# Patient Record
Sex: Female | Born: 1970
Health system: Southern US, Community
[De-identification: ages and names within clinical notes are randomized; demographics above are authoritative.]

## PROBLEM LIST (undated history)

## (undated) DIAGNOSIS — N200 Calculus of kidney: Secondary | ICD-10-CM

## (undated) DIAGNOSIS — K219 Gastro-esophageal reflux disease without esophagitis: Secondary | ICD-10-CM

## (undated) DIAGNOSIS — R809 Proteinuria, unspecified: Secondary | ICD-10-CM

## (undated) DIAGNOSIS — R112 Nausea with vomiting, unspecified: Secondary | ICD-10-CM

## (undated) DIAGNOSIS — Z9889 Other specified postprocedural states: Secondary | ICD-10-CM

## (undated) DIAGNOSIS — N809 Endometriosis, unspecified: Secondary | ICD-10-CM

## (undated) DIAGNOSIS — I10 Essential (primary) hypertension: Secondary | ICD-10-CM

## (undated) DIAGNOSIS — Z87442 Personal history of urinary calculi: Secondary | ICD-10-CM

## (undated) DIAGNOSIS — E785 Hyperlipidemia, unspecified: Secondary | ICD-10-CM

## (undated) DIAGNOSIS — N189 Chronic kidney disease, unspecified: Secondary | ICD-10-CM

## (undated) DIAGNOSIS — A4902 Methicillin resistant Staphylococcus aureus infection, unspecified site: Secondary | ICD-10-CM

## (undated) HISTORY — PX: COLONOSCOPY: SHX174

## (undated) HISTORY — DX: Calculus of kidney: N20.0

## (undated) HISTORY — DX: Chronic kidney disease, unspecified: N18.9

## (undated) HISTORY — PX: DIAGNOSTIC LAPAROSCOPY: SUR761

## (undated) HISTORY — DX: Hyperlipidemia, unspecified: E78.5

## (undated) HISTORY — PX: DILATION AND CURETTAGE OF UTERUS: SHX78

## (undated) HISTORY — PX: CERVICAL DISCECTOMY: SHX98

## (undated) HISTORY — DX: Endometriosis, unspecified: N80.9

## (undated) HISTORY — DX: Proteinuria, unspecified: R80.9

## (undated) HISTORY — PX: REDUCTION MAMMAPLASTY: SUR839

---

## 2002-04-20 ENCOUNTER — Inpatient Hospital Stay (HOSPITAL_COMMUNITY): Admission: AD | Admit: 2002-04-20 | Discharge: 2002-04-20 | Payer: Self-pay | Admitting: *Deleted

## 2002-04-21 ENCOUNTER — Inpatient Hospital Stay (HOSPITAL_COMMUNITY): Admission: AD | Admit: 2002-04-21 | Discharge: 2002-04-21 | Payer: Self-pay | Admitting: Obstetrics and Gynecology

## 2002-05-24 ENCOUNTER — Inpatient Hospital Stay (HOSPITAL_COMMUNITY): Admission: AD | Admit: 2002-05-24 | Discharge: 2002-05-26 | Payer: Self-pay | Admitting: Obstetrics and Gynecology

## 2002-07-04 ENCOUNTER — Other Ambulatory Visit: Admission: RE | Admit: 2002-07-04 | Discharge: 2002-07-04 | Payer: Self-pay | Admitting: Obstetrics and Gynecology

## 2003-07-23 ENCOUNTER — Other Ambulatory Visit: Admission: RE | Admit: 2003-07-23 | Discharge: 2003-07-23 | Payer: Self-pay | Admitting: Obstetrics and Gynecology

## 2004-07-09 ENCOUNTER — Ambulatory Visit (HOSPITAL_COMMUNITY): Admission: RE | Admit: 2004-07-09 | Discharge: 2004-07-09 | Payer: Self-pay | Admitting: Obstetrics and Gynecology

## 2004-07-09 ENCOUNTER — Encounter (INDEPENDENT_AMBULATORY_CARE_PROVIDER_SITE_OTHER): Payer: Self-pay | Admitting: *Deleted

## 2005-08-26 ENCOUNTER — Inpatient Hospital Stay (HOSPITAL_COMMUNITY): Admission: RE | Admit: 2005-08-26 | Discharge: 2005-08-26 | Payer: Self-pay | Admitting: Obstetrics & Gynecology

## 2005-08-27 ENCOUNTER — Inpatient Hospital Stay (HOSPITAL_COMMUNITY): Admission: AD | Admit: 2005-08-27 | Discharge: 2005-08-27 | Payer: Self-pay | Admitting: Obstetrics and Gynecology

## 2005-09-21 ENCOUNTER — Inpatient Hospital Stay (HOSPITAL_COMMUNITY): Admission: RE | Admit: 2005-09-21 | Discharge: 2005-09-23 | Payer: Self-pay | Admitting: Obstetrics and Gynecology

## 2006-07-05 ENCOUNTER — Encounter: Admission: RE | Admit: 2006-07-05 | Discharge: 2006-07-05 | Payer: Self-pay | Admitting: Obstetrics and Gynecology

## 2009-08-15 ENCOUNTER — Encounter: Admission: RE | Admit: 2009-08-15 | Discharge: 2009-08-15 | Payer: Self-pay | Admitting: Orthopedic Surgery

## 2009-09-02 ENCOUNTER — Ambulatory Visit (HOSPITAL_COMMUNITY): Admission: RE | Admit: 2009-09-02 | Discharge: 2009-09-03 | Payer: Self-pay | Admitting: Neurological Surgery

## 2009-10-01 ENCOUNTER — Encounter: Admission: RE | Admit: 2009-10-01 | Discharge: 2009-10-01 | Payer: Self-pay | Admitting: Neurological Surgery

## 2009-12-02 ENCOUNTER — Encounter: Admission: RE | Admit: 2009-12-02 | Discharge: 2009-12-02 | Payer: Self-pay | Admitting: Anesthesiology

## 2010-02-25 ENCOUNTER — Encounter
Admission: RE | Admit: 2010-02-25 | Discharge: 2010-02-25 | Payer: Self-pay | Source: Home / Self Care | Attending: Neurological Surgery | Admitting: Neurological Surgery

## 2010-06-01 LAB — CBC
HCT: 42.6 % (ref 36.0–46.0)
MCHC: 34 g/dL (ref 30.0–36.0)
MCV: 89.9 fL (ref 78.0–100.0)
Platelets: 237 10*3/uL (ref 150–400)
RBC: 4.74 MIL/uL (ref 3.87–5.11)
WBC: 6.2 10*3/uL (ref 4.0–10.5)

## 2010-06-01 LAB — DIFFERENTIAL
Basophils Relative: 0 % (ref 0–1)
Eosinophils Relative: 2 % (ref 0–5)
Monocytes Absolute: 0.7 10*3/uL (ref 0.1–1.0)
Monocytes Relative: 12 % (ref 3–12)
Neutro Abs: 3.2 10*3/uL (ref 1.7–7.7)

## 2010-06-01 LAB — PROTIME-INR: INR: 1 (ref 0.00–1.49)

## 2010-06-01 LAB — BASIC METABOLIC PANEL
BUN: 9 mg/dL (ref 6–23)
Chloride: 98 mEq/L (ref 96–112)
Creatinine, Ser: 0.86 mg/dL (ref 0.4–1.2)
GFR calc non Af Amer: >60 mL/min (ref >60)
Glucose, Bld: 99 mg/dL (ref 70–99)
Sodium: 139 mEq/L (ref 135–145)

## 2010-06-01 LAB — SURGICAL PCR SCREEN: Staphylococcus aureus: POSITIVE — AB

## 2010-06-04 ENCOUNTER — Encounter (HOSPITAL_COMMUNITY)
Admission: RE | Admit: 2010-06-04 | Discharge: 2010-06-04 | Disposition: A | Discharge status: Home / Self Care | Payer: BC Managed Care – PPO | Source: Ambulatory Visit | Attending: Obstetrics and Gynecology | Admitting: Obstetrics and Gynecology

## 2010-06-04 LAB — BASIC METABOLIC PANEL
BUN: 10 mg/dL (ref 6–23)
Calcium: 9 mg/dL (ref 8.4–10.5)
GFR calc Af Amer: >60 mL/min (ref >60)
GFR calc non Af Amer: >60 mL/min (ref >60)
Glucose, Bld: 119 mg/dL — ABNORMAL HIGH (ref 70–99)
Sodium: 139 mEq/L (ref 135–145)

## 2010-06-04 LAB — SURGICAL PCR SCREEN
MRSA, PCR: NEGATIVE
Staphylococcus aureus: NEGATIVE

## 2010-06-04 LAB — CBC
HCT: 41.9 % (ref 36.0–46.0)
MCHC: 33.2 g/dL (ref 30.0–36.0)
MCV: 87.8 fL (ref 78.0–100.0)
RBC: 4.77 MIL/uL (ref 3.87–5.11)
WBC: 7.1 10*3/uL (ref 4.0–10.5)

## 2010-06-10 ENCOUNTER — Other Ambulatory Visit: Payer: Self-pay | Admitting: Obstetrics and Gynecology

## 2010-06-10 ENCOUNTER — Ambulatory Visit (HOSPITAL_COMMUNITY)
Admission: RE | Admit: 2010-06-10 | Discharge: 2010-06-10 | Disposition: A | Discharge status: Home / Self Care | Payer: BC Managed Care – PPO | Source: Ambulatory Visit | Attending: Obstetrics and Gynecology | Admitting: Obstetrics and Gynecology

## 2010-06-10 DIAGNOSIS — Z01818 Encounter for other preprocedural examination: Secondary | ICD-10-CM | POA: Insufficient documentation

## 2010-06-10 DIAGNOSIS — D279 Benign neoplasm of unspecified ovary: Secondary | ICD-10-CM | POA: Insufficient documentation

## 2010-06-10 DIAGNOSIS — Z01812 Encounter for preprocedural laboratory examination: Secondary | ICD-10-CM | POA: Insufficient documentation

## 2010-07-04 NOTE — H&P (Signed)
  Ashley Edwards, Ashley Edwards             ACCOUNT NO.:  [id number]  MEDICAL RECORD NO.:  [id number]        PATIENT TYPE:  WAMB  LOCATION:                                [facility]:  [facility]  PHYSICIAN:  Lenoard Aden, M.D.DATE OF BIRTH:  [birth year]  DATE OF ADMISSION:  06/10/2010 DATE OF DISCHARGE:                             HISTORY & PHYSICAL   CHIEF COMPLAINT:  Symptomatic enlarging uterine fibroids.  HISTORY OF PRESENT ILLNESS:  She is a 40 year old white female G4 P3 with a history of vaginal delivery x3, SAB x1, symptomatic enlarging fibroids and pelvic pain.  MEDICATIONS:  Xanax p.r.n., diltiazem CD, hydrochlorothiazide, and Tylenol Extra Strength p.r.n.  ALLERGIES:  She has allergies to DILAUDID.  No known latex allergies.  FAMILY HISTORY:  Stroke, chronic hypertension, skin cancer.  PERSONAL HISTORY:  Three vaginal deliveries complicated by chronic hypertension, history of cryosurgery in 1998, history of neck surgery with disk replacement in 2011 and again a missed AB in 2006, contraceptive management to include vasectomy.  PHYSICAL EXAMINATION:  VITAL SIGNS:  Weight of 160 pounds, blood pressure 122/80, height 5 feet 1-1/2 inches. GENERAL:  She is well-developed, well-nourished white female, in no acute distress. NECK:  Supple.  Full range of motion. LUNGS:  Clear. HEART:  Regular rate and rhythm. ABDOMEN:  Soft, nontender. PELVIC:  Enlarged and irregularly shaped uterus with irregularity since the fibroids. NEUROLOGIC:  Nonfocal. SKIN:  Intact.  IMPRESSION:  Enlarging uterine fibroids with left lateral fibroids consistent with questionable pedunculated fibroids responsible for the patient's  continueed pelvic discomfort.  PLAN:  To proceed with da Vinci assisted robotic myomectomy. The risks of anesthesia, infection, bleeding, injury to abdominal organs, need for repair, delayed versus immediate complications to include bowel and bladder injury noted,  postoperative risks include DVT, pneumonia, and need for blood transfusion is noted, possible recurrence risks of fibroids is 20-25%, possible need for hysterectomy during this procedure approximately 5% noted.  The patient acknowledges, consents are signed and desires to proceed.     Lenoard Aden, M.D.     RJT/MEDQ  D:  06/10/2010  T:  06/10/2010  Job:  [id number]  Electronically Signed by Olivia Mackie M.D. on 07/04/2010 03:17:27 PM

## 2010-07-04 NOTE — Op Note (Signed)
NAMEANIS, CINELLI             ACCOUNT NO.:  [id number]  MEDICAL RECORD NO.:  [id number]           PATIENT TYPE:  O  LOCATION:  [facility]                          [facility]  PHYSICIAN:  Lenoard Aden, M.D.DATE OF BIRTH:  1970-12-08  DATE OF PROCEDURE: DATE OF DISCHARGE:                              OPERATIVE REPORT   PREOPERATIVE DIAGNOSIS:  Symptomatic uterine fibroids.  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  PROCEDURES:  Da Vinci-assisted left salpingo-oophorectomy with pelvic washings and frozen section as noted.  SURGEON:  Lenoard Aden, MD  ASSISTANT:  Genia Del, MD  ANESTHESIA:  General.  ESTIMATED BLOOD LOSS:  Less than 50 mL.  COMPLICATIONS:  None.  DRAINS:  Foley.  COUNTS:  Correct.  The patient recovered in good condition.  BRIEF OPERATIVE NOTE:  After being apprised of the risks of anesthesia, infection, bleeding, injury to abdominal organs, need for repair delayed versus immediate complications to include bowel and bladder injury, possible need for repair, the patient was brought to the operating room. Please note that prior to this surgery previously when this mass was first diagnosed approximately 2-3 years ago, it was thought to be either a left ovarian mass versus a fibroid.  At that time an MRI was performed which reported this mass consistent with a pedunculated uterine fibroid. The patient therefore has signed consent.  She was brought to the operating room where she was administered general anesthetic without complications, prepped and draped in usual sterile fashion.  Foley catheter was placed.  After achieving adequate anesthesia, exam under anesthesia revealed an irregularly shaped pelvic mass thought to be consistent with a uterine fibroid.  The Rumi retractor was placed vaginally without difficulty and attention turned to the abdominal portion of the  procedure whereby an incision was made about 3 fingerbreadths above the umbilicus.  Veress needle was placed, opening pressure -2 noted and 3-1/2 L of CO2 insufflated without difficulty. Trocar was placed atraumatically.  Additional trocar sites were placed in preparation for the robot in a semicircular fashion around this midline incision approximately 10 cm apart and all trocars were placed under direct visualization, three 8 mm trocars and a 10-mm trocar for the assistance port.  Visualization at this time has revealed a left multilobulated left ovarian mass which has a smooth surface and a fibrous appearance to it.  The uterus appears otherwise normal.  The right ovary appears normal.  The left ovary was torsed in conjunction with this mass.  At this time decision was made to proceed with pelvic washings which were collected in the usual fashion and sent to pathology.  Approximately 500 mL total and biopsy forceps were entered but the mass due to its fibers element was difficult to biopsy. Therefore a piece of the mass was grasped and EndoShears were used to remove a 2-3 cm portion of this mass which was then sent for frozen section.  Good hemostasis has been noted at this time, minimal bleeding from that area and the mass was sent for frozen section was confirmed to be by Dr. Nedra Hai an ovarian fibrothecoma with no  malignant elements noted and appeared benign per her report.  Therefore decision was made to proceed with removal of the mass which was approximately 10-12 cm in size.  The infundibulopelvic ligament and the course of the ureters were identified on the left.  The PK forceps were used to cauterize the infundibulopelvic ligament and all the way down to the tuboovarian ligament on the left side completely detaching the mass and its entirety.  Good hemostasis was noted.  Irrigation was accomplished and due to its benign nature, the decision was made to morcellate the mass which  was morcellated using the Storz morcellator in its entirety. Irrigation was accomplished.  Good hemostasis was noted.  The right ovary and tube as noted appeared normal.  The uterus appeared normal. Anterior and posterior cul-de-sac appeared normal.  The appendix appeared normal.  Liver and gallbladder area all appeared normal, thereby bowel and omentum all appeared normal upon visualization and irrigation.  At this time, all instruments were removed under direct visualization.  CO2 was released.  Incisions were closed using 0 Vicryl, 4-0 Vicryl and Dermabond.  Rumi retractor was removed from the vagina. Foley catheter was removed.  The patient tolerated the procedure well, was awakened and transferred to recovery room in good condition.     Lenoard Aden, M.D.     RJT/MEDQ  D:  06/10/2010  T:  06/11/2010  Job:  [id number]  cc:   [facility]  Electronically Signed by Olivia Mackie M.D. on 07/04/2010 03:17:36 PM

## 2010-08-01 NOTE — H&P (Signed)
   Ashley Edwards, WUNSCHEL                         ACCOUNT NO.:  [id number]   MEDICAL RECORD NO.:  [id number]                   PATIENT TYPE:  INP   LOCATION:  NA                                   [facility]   PHYSICIAN:  Lenoard Aden, M.D.             DATE OF BIRTH:  1970-06-04   DATE OF ADMISSION:  DATE OF DISCHARGE:                                HISTORY & PHYSICAL   CHIEF COMPLAINT:  Chronic hypertension and advanced dilatation at term.   HISTORY OF PRESENT ILLNESS:  The patient is a 40 year old white female G2,  P1 at 38 weeks with 4 to 5 cm of dilatation and chronic hypertension on  Labetalol for induction.   MEDICATIONS:  Patient is on Labetalol 100 mg b.i.d.   ALLERGIES:  Patient has no known drug allergies.   PAST MEDICAL HISTORY:  Her past obstetrical history is remarkable for an  uncomplicated vaginal delivery of a 7 pound 8 ounce female in 2002.  The  patient otherwise has had no other surgery.  The patient has a history of  wisdom tooth removal.   FAMILY HISTORY:  Family history of myocardial infarction, urinary tract  infections and macular degeneration.   LABORATORY DATA:  Prenatal laboratory data blood type O positive, Rh  negative,  rubella immune.   PHYSICAL EXAMINATION:  GENERAL:  The patient is a well-developed, well-  nourished white female in no apparent distress.  HEENT:  Normal.  LUNGS:  Clear.  HEART:  Regular rhythm.  ABDOMEN:  Soft, gravid, nontender.  Estimated fetal weight of approximately  7.5 pounds.  PELVIC:  Cervix is 4 to 5 cm dilated, 80%, vertex and 0.  EXTREMITIES:  Normal.  NEUROLOGICAL:  Examination is nonfocal.   IMPRESSION:  Term intrauterine pregnancy with chronic hypertension and  advanced dilatation for induction.  Group beta Strep negative.   PLAN:  Proceed with induction.                                               Lenoard Aden, M.D.    RJT/MEDQ  D:  05/24/2002  T:  05/24/2002  Job:  [id number]

## 2010-08-01 NOTE — Op Note (Signed)
Ashley Edwards, Ashley Edwards             ACCOUNT NO.:  [id number]   MEDICAL RECORD NO.:  [id number]          PATIENT TYPE:  AMB   LOCATION:  [facility]                           [facility]   PHYSICIAN:  Lenoard Aden, M.D.DATE OF BIRTH:  1970/10/11   DATE OF PROCEDURE:  07/09/2004  DATE OF DISCHARGE:                                 OPERATIVE REPORT   PREOPERATIVE DIAGNOSES:  Missed abortion at six weeks.   POSTOPERATIVE DIAGNOSES:  Missed abortion at six weeks.   PROCEDURE:  Suction D&E.   SURGEON:  Lenoard Aden, M.D.   ANESTHESIA:  MAC paracervical.   ESTIMATED BLOOD LOSS:  Less than 50 mL.   COMPLICATIONS:  None.   DRAINS:  None.   COUNTS:  Correct.   Patient to recovery in good condition.   DESCRIPTION OF PROCEDURE:  After being apprised of the risks of anesthesia,  infection, bleeding, uterine perforation, possible need for repair, delayed  risks, immediate complications to include bowel and bladder injury from  uterine perforation, the patient was brought to the operating room after  consents are signed. Placed in dorsal lithotomy position, feet are placed in  the yellow fin stirrups, prepped and draped in the usual sterile fashion,  catheterized until the bladder was empty. Examination under anesthesia  reveals a six week anteflexed uterus, no adnexal masses. Dilute Marcaine  solution, 20 mL of 0.25% solution placed at standard paracervical block. The  uterus sounds to 8 cm and dilates easily up to a #21 Pratt dilator. A 7 mm  suction curette placed, products of conception noted and aspirated without  difficulty. Repeat suction and curettage in a four quadrant method reveals  the cavity to be empty. Estimated blood loss less than 50 mL. Procedure is  terminated. The patient is awakened and transferred to recovery in good  condition.      RJT/MEDQ  D:  07/09/2004  T:  07/09/2004  Job:  [id number]

## 2010-08-01 NOTE — H&P (Signed)
Ashley Edwards, Edwards             ACCOUNT NO.:  [id number]   MEDICAL RECORD NO.:  [id number]          PATIENT TYPE:  INP   LOCATION:  9163                          [facility]   PHYSICIAN:  Lenoard Aden, M.D.DATE OF BIRTH:  01-Jul-1970   DATE OF ADMISSION:  09/21/2005  DATE OF DISCHARGE:                                HISTORY & PHYSICAL   CHIEF COMPLAINT:  Chronic hypertension, at term.   HISTORY OF PRESENT ILLNESS:  The patient is a 40 year old white female G3,  P2 who presents at 37 weeks for induction due to advanced dilatation,  history of precipitous labor, and chronic hypertension.   ALLERGIES:  NO KNOWN DRUG ALLERGIES.   MEDICATIONS:  Labetalol 100 mg p.o. t.i.d.   SOCIAL HISTORY:  She is nonsmoker, nondrinker.  Denies domestic or physical  violence.   PAST MEDICAL HISTORY:  History of chronic hypertension chronically on  medications; otherwise, uncomplicated past medical history.  She had a  history of an ovarian cyst, condyloma, and a D&C for a missed AB in 2006.   PAST OBSTETRICAL HISTORY:  Remarkable for one uncomplicated D&C for a missed  AB and two uncomplicated vaginal deliveries complicated by chronic  hypertension and advanced dilatation and precipitous labor.  Pregnancy  course otherwise uncomplicated.   FAMILY HISTORY:  Stroke, chronic hypertension, skin cancer.   PHYSICAL EXAMINATION:  GENERAL:  She is a well-developed, well-nourished  white female in no acute distress.  VITAL SIGNS:  Blood pressure 130/72.  HEENT:  Normal.  LUNGS:  Clear.  HEART:  Regular rate and rhythm.  ABDOMEN:  Soft, gravid, and nontender.  PELVIC:  The cervix is 3-4 cm, 80%, vertex, and 0.  EXTREMITIES:  __________  NEUROLOGIC:  Nonfocal.  Intact.  SKIN:  Intact.   IMPRESSION:  1.  37-week intrauterine pregnancy  2.  Chronic hypertension.  3.  Advanced dilatation.   PLAN:  Proceed with controlled attempt at vaginal delivery, preeclampsia  precautions.  Small risk  of prematurity discussed.  Patient acknowledges.  Will proceed.      Lenoard Aden, M.D.  Electronically Signed     RJT/MEDQ  D:  09/21/2005  T:  09/21/2005  Job:  [id number]

## 2011-12-18 ENCOUNTER — Encounter (HOSPITAL_COMMUNITY): Payer: Self-pay | Admitting: Pharmacist

## 2011-12-25 ENCOUNTER — Encounter (HOSPITAL_COMMUNITY)
Admission: RE | Admit: 2011-12-25 | Discharge: 2011-12-25 | Disposition: A | Discharge status: Home / Self Care | Payer: BC Managed Care – PPO | Source: Ambulatory Visit | Attending: Obstetrics and Gynecology | Admitting: Obstetrics and Gynecology

## 2011-12-25 ENCOUNTER — Encounter (HOSPITAL_COMMUNITY): Payer: Self-pay

## 2011-12-25 HISTORY — DX: Essential (primary) hypertension: I10

## 2011-12-25 LAB — CBC
MCH: 28.7 pg (ref 26.0–34.0)
MCHC: 32.4 g/dL (ref 30.0–36.0)
Platelets: 207 10*3/uL (ref 150–400)
RBC: 4.6 MIL/uL (ref 3.87–5.11)

## 2011-12-25 LAB — BASIC METABOLIC PANEL
CO2: 26 mEq/L (ref 19–32)
Chloride: 99 mEq/L (ref 96–112)
GFR calc non Af Amer: >90 mL/min (ref >90)
Potassium: 3.3 mEq/L — ABNORMAL LOW (ref 3.5–5.1)
Sodium: 137 mEq/L (ref 135–145)

## 2011-12-25 NOTE — Patient Instructions (Signed)
Your procedure is scheduled on:01/07/12  Enter through the Main Entrance at :1130am  Pick up desk phone and dial [phone] and inform us of your arrival.  Please call [phone] if you have any problems the morning of surgery.  Remember: Do not eat after midnight:Wed Do not drink after:9am on Thursday  Take these meds the morning of surgery with a sip of water:BP pill  DO NOT wear jewelry, eye make-up, lipstick,body lotion, or dark fingernail polish. Do not shave for 48 hours prior to surgery.   Patients discharged on the day of surgery will not be allowed to drive home.   Remember to use your Hibiclens as instructed.

## 2011-12-25 NOTE — Pre-Procedure Instructions (Signed)
Pt has h/o positive MRSA swab and will need to be swabbed on DOS.

## 2011-12-30 ENCOUNTER — Other Ambulatory Visit: Payer: Self-pay | Admitting: Obstetrics and Gynecology

## 2012-01-06 NOTE — H&P (Signed)
NAMECASSARA, Ashley Edwards             ACCOUNT NO.:  [id number]  MEDICAL RECORD NO.:  [id number]  LOCATION:  [facility]                         [facility]  [facility]  PHYSICIAN:  Lenoard Aden, M.D.DATE OF BIRTH:  [birth year]  DATE OF ADMISSION:  11/19/2011 DATE OF DISCHARGE:                             HISTORY & PHYSICAL   Women's outpatient surgery on January 07, 2012.  CHIEF COMPLAINT:  Refractory dysmenorrhea and menorrhagia.  She is a 41- year-old white female, G4, P3, has been status post oophorectomy who presented for definitive management of menorrhagia and dysmenorrhea.  ALLERGIES:  DILAUDID.  SOCIAL HISTORY:  She is a nonsmoker, nondrinker.  Denies domestic or physical violence.  MEDICATIONS:  Include Zofran p.r.n., Xanax p.r.n.  Hydrochlorothiazide, diltiazem, and Tylenol p.r.n.  She has personal history of hypertension.  FAMILY HISTORY:  Stroke, skin cancer, chronic hypertension.  OB HISTORY:  History of 3 vaginal deliveries, and 1 missed AB.  She had a history of robotic surgery for ovarian fibroma.  She has history D and C prior surgery.  PHYSICAL EXAMINATION:  GENERAL:  A well-developed, well-nourished, white female, in no acute distress. HEENT:  Normal. NECK:  Supple.  Full range of motion. LUNGS:  Clear. HEART:  Regular rhythm. ABDOMEN:  Soft and nontender. PELVIC:  Uterus to be normal size, shape, and contour, anteflexed.  No adnexal masses.  IMPRESSION:  Refractory dysmenorrhea and menorrhagia for definitive therapy.  PLAN:  Proceed with diagnostic hysteroscopy, D and C, NovaSure.  Risks of anesthesia, infection, bleeding, injury to abdominal organs, need for repair was discussed, delayed versus immediate complications to include bowel and bladder injury noted.  The patient acknowledges and wishes to proceed.     Lenoard Aden, M.D.     RJT/MEDQ  D:  01/06/2012  T:  01/06/2012  Job:  [id number]

## 2012-01-07 ENCOUNTER — Encounter (HOSPITAL_COMMUNITY): Payer: Self-pay | Admitting: Anesthesiology

## 2012-01-07 ENCOUNTER — Encounter (HOSPITAL_COMMUNITY)
Admission: RE | Disposition: A | Discharge status: Home / Self Care | Payer: Self-pay | Source: Ambulatory Visit | Attending: Obstetrics and Gynecology

## 2012-01-07 ENCOUNTER — Ambulatory Visit (HOSPITAL_COMMUNITY)
Admission: RE | Admit: 2012-01-07 | Discharge: 2012-01-07 | Disposition: A | Discharge status: Home / Self Care | Payer: BC Managed Care – PPO | Source: Ambulatory Visit | Attending: Obstetrics and Gynecology | Admitting: Obstetrics and Gynecology

## 2012-01-07 ENCOUNTER — Ambulatory Visit (HOSPITAL_COMMUNITY): Payer: BC Managed Care – PPO | Admitting: Anesthesiology

## 2012-01-07 DIAGNOSIS — Z01812 Encounter for preprocedural laboratory examination: Secondary | ICD-10-CM | POA: Insufficient documentation

## 2012-01-07 DIAGNOSIS — N946 Dysmenorrhea, unspecified: Secondary | ICD-10-CM | POA: Insufficient documentation

## 2012-01-07 DIAGNOSIS — Z01818 Encounter for other preprocedural examination: Secondary | ICD-10-CM | POA: Insufficient documentation

## 2012-01-07 DIAGNOSIS — N920 Excessive and frequent menstruation with regular cycle: Secondary | ICD-10-CM | POA: Insufficient documentation

## 2012-01-07 LAB — HCG, SERUM, QUALITATIVE: Preg, Serum: NEGATIVE

## 2012-01-07 SURGERY — DILATATION & CURETTAGE/HYSTEROSCOPY WITH NOVASURE ABLATION
Anesthesia: General | Site: Vagina | Wound class: Clean Contaminated

## 2012-01-07 MED ORDER — LACTATED RINGERS IV SOLN
INTRAVENOUS | Status: DC
  Administered 2012-01-07 (×2): via INTRAVENOUS

## 2012-01-07 MED ORDER — TRAMADOL HCL 50 MG PO TABS: 50.0000 mg | ORAL_TABLET | Freq: Four times a day (QID) | ORAL | Status: DC | PRN

## 2012-01-07 MED ORDER — CEFAZOLIN SODIUM-DEXTROSE 2-3 GM-% IV SOLR
2.0000 g | INTRAVENOUS | Status: AC
  Administered 2012-01-07: 2 g via INTRAVENOUS

## 2012-01-07 MED ORDER — KETOROLAC TROMETHAMINE 30 MG/ML IJ SOLN
INTRAMUSCULAR | Status: DC | PRN
  Administered 2012-01-07: 30 mg via INTRAVENOUS
  Administered 2012-01-07: 30 mg via INTRAMUSCULAR

## 2012-01-07 MED ORDER — PROPOFOL 10 MG/ML IV EMUL
INTRAVENOUS | Status: DC | PRN
  Administered 2012-01-07: 200 mg via INTRAVENOUS

## 2012-01-07 MED ORDER — BUPIVACAINE HCL (PF) 0.25 % IJ SOLN
INTRAMUSCULAR | Status: DC | PRN
  Administered 2012-01-07: 20 mL

## 2012-01-07 MED ORDER — DEXAMETHASONE SODIUM PHOSPHATE 4 MG/ML IJ SOLN
INTRAMUSCULAR | Status: DC | PRN
  Administered 2012-01-07: 8 mg via INTRAVENOUS

## 2012-01-07 MED ORDER — LACTATED RINGERS IR SOLN
Status: DC | PRN
  Administered 2012-01-07: 3000 mL

## 2012-01-07 MED ORDER — OXYCODONE-ACETAMINOPHEN 5-325 MG PO TABS: 1.0000 | ORAL_TABLET | ORAL | Status: DC | PRN

## 2012-01-07 MED ORDER — MIDAZOLAM HCL 5 MG/5ML IJ SOLN
INTRAMUSCULAR | Status: DC | PRN
  Administered 2012-01-07: 2 mg via INTRAVENOUS

## 2012-01-07 MED ORDER — GLYCOPYRROLATE 0.2 MG/ML IJ SOLN
INTRAMUSCULAR | Status: DC | PRN
  Administered 2012-01-07 (×2): 0.1 mg via INTRAVENOUS

## 2012-01-07 MED ORDER — FENTANYL CITRATE 0.05 MG/ML IJ SOLN
INTRAMUSCULAR | Status: DC | PRN
  Administered 2012-01-07: 100 ug via INTRAVENOUS
  Administered 2012-01-07 (×2): 50 ug via INTRAVENOUS
  Administered 2012-01-07 (×2): 25 ug via INTRAVENOUS

## 2012-01-07 MED ORDER — LIDOCAINE HCL (CARDIAC) 20 MG/ML IV SOLN
INTRAVENOUS | Status: DC | PRN
  Administered 2012-01-07: 50 mg via INTRAVENOUS

## 2012-01-07 MED ORDER — ONDANSETRON HCL 4 MG/2ML IJ SOLN
INTRAMUSCULAR | Status: DC | PRN
  Administered 2012-01-07: 4 mg via INTRAVENOUS

## 2012-01-07 SURGICAL SUPPLY — 13 items
ABLATOR ENDOMETRIAL BIPOLAR (ABLATOR) ×2 IMPLANT
CATH ROBINSON RED A/P 16FR (CATHETERS) ×2 IMPLANT
CLOTH BEACON ORANGE TIMEOUT ST (SAFETY) ×2 IMPLANT
CONTAINER PREFILL 10% NBF 60ML (FORM) ×4 IMPLANT
DRESSING TELFA 8X3 (GAUZE/BANDAGES/DRESSINGS) ×2 IMPLANT
GLOVE BIO SURGEON STRL SZ7.5 (GLOVE) ×4 IMPLANT
GOWN STRL REIN XL XLG (GOWN DISPOSABLE) ×4 IMPLANT
PACK HYSTEROSCOPY LF (CUSTOM PROCEDURE TRAY) ×2 IMPLANT
PAD OB MATERNITY 4.3X12.25 (PERSONAL CARE ITEMS) ×2 IMPLANT
PAD PREP 24X48 CUFFED NSTRL (MISCELLANEOUS) ×2 IMPLANT
SYR TB 1ML 25GX5/8 (SYRINGE) ×2 IMPLANT
TOWEL OR 17X24 6PK STRL BLUE (TOWEL DISPOSABLE) ×4 IMPLANT
WATER STERILE IRR 1000ML POUR (IV SOLUTION) ×2 IMPLANT

## 2012-01-07 NOTE — Progress Notes (Signed)
Patient ID: Ashley Edwards, female   DOB: 1971/01/16, 41 y.o.   MRN: [id number] Patient seen and examined. Consent witnessed and signed. No changes noted. Update completed.

## 2012-01-07 NOTE — Anesthesia Preprocedure Evaluation (Signed)
Anesthesia Evaluation  Patient identified by MRN, date of birth, ID band Patient awake    Reviewed: Allergy & Precautions, H&P , NPO status , Patient's Chart, lab work & pertinent test results  Airway Mallampati: II TM Distance: >3 FB Neck ROM: full    Dental No notable dental hx. (+) Teeth Intact   Pulmonary neg pulmonary ROS,    Pulmonary exam normal       Cardiovascular hypertension, Pt. on medications Rhythm:regular Rate:Normal     Neuro/Psych negative neurological ROS     GI/Hepatic negative GI ROS, Neg liver ROS,   Endo/Other  negative endocrine ROS  Renal/GU negative Renal ROS  negative genitourinary   Musculoskeletal   Abdominal Normal abdominal exam  (+)   Peds  Hematology negative hematology ROS (+)   Anesthesia Other Findings   Reproductive/Obstetrics negative OB ROS Menorrhagia                            Anesthesia Physical Anesthesia Plan  ASA: II  Anesthesia Plan: General LMA   Post-op Pain Management:    Induction:   Airway Management Planned:   Additional Equipment:   Intra-op Plan:   Post-operative Plan:   Informed Consent: I have reviewed the patients History and Physical, chart, labs and discussed the procedure including the risks, benefits and alternatives for the proposed anesthesia with the patient or authorized representative who has indicated his/her understanding and acceptance.   Dental Advisory Given  Plan Discussed with: Anesthesiologist, CRNA and Surgeon  Anesthesia Plan Comments:         Anesthesia Quick Evaluation

## 2012-01-07 NOTE — Op Note (Signed)
01/07/2012  1:58 PM  PATIENT:  Campbell Stall  41 y.o. female  PRE-OPERATIVE DIAGNOSIS:  Menorrhagia-Refractory  POST-OPERATIVE DIAGNOSIS:  menorrhagia  PROCEDURE:  Procedure(s): DILATATION & CURETTAGE/HYSTEROSCOPY WITH NOVASURE ABLATION  SURGEON:  Surgeon(s): Lenoard Aden, MD  ASSISTANTS: none   ANESTHESIA:   local and general  ESTIMATED BLOOD LOSS: minimal  DRAINS: none   LOCAL MEDICATIONS USED:  MARCAINE     SPECIMEN:  Source of Specimen:  [facility]  DISPOSITION OF SPECIMEN:  PATHOLOGY  COUNTS:  YES  DICTATION #: [id number]  PLAN OF CARE: DC home today  PATIENT DISPOSITION:  PACU - hemodynamically stable.

## 2012-01-07 NOTE — Transfer of Care (Signed)
Immediate Anesthesia Transfer of Care Note  Patient: Ashley Edwards  Procedure(s) Performed: Procedure(s) (LRB) with comments: DILATATION & CURETTAGE/HYSTEROSCOPY WITH NOVASURE ABLATION (N/A)  Patient Location: PACU  Anesthesia Type: General  Level of Consciousness: awake, alert  and oriented  Airway & Oxygen Therapy: Patient Spontanous Breathing and Patient connected to nasal cannula oxygen  Post-op Assessment: Report given to PACU RN and Post -op Vital signs reviewed and stable  Post vital signs: Reviewed and stable  Complications: No apparent anesthesia complications

## 2012-01-07 NOTE — Anesthesia Postprocedure Evaluation (Signed)
  Anesthesia Post-op Note  Patient: Ashley Edwards  Procedure(s) Performed: Procedure(s) (LRB) with comments: DILATATION & CURETTAGE/HYSTEROSCOPY WITH NOVASURE ABLATION (N/A)  Patient is awake and responsive. Pain and nausea are reasonably well controlled. Vital signs are stable and clinically acceptable. Oxygen saturation is clinically acceptable. There are no apparent anesthetic complications at this time. Patient is ready for discharge.

## 2012-01-08 NOTE — Op Note (Signed)
[name], Ashley Edwards             ACCOUNT NO.:  [id number]  MEDICAL RECORD NO.:  [id number]  LOCATION:  [facility]                          [facility]ILITY:  [facility]  PHYSICIAN:  Lenoard Aden, M.D.DATE OF BIRTH:  [birth year]  DATE OF PROCEDURE: DATE OF DISCHARGE:  01/07/2012                              OPERATIVE REPORT   DESCRIPTION OF PROCEDURE:  After being apprised of the risks of anesthesia, infection, bleeding, injury to abdominal organs, possible need for repair, delayed versus immediate complications to include bowel and bladder injury, possible need for repair, inability to cure all bleeding, the patient was brought to the operating room after consents were signed.  She was prepped and draped in usual sterile fashion.  Feet were placed in Yellofin stirrups.  Exam under anesthesia reveals a normal sized anteflexed uterus and no adnexal masses.  Dilute paracervical block placed with a dilute Marcaine solution 20 mL total, cervix easily dilated up to a #23 Pratt dilator.  Hysteroscope placed. Visualization reveals a normal endometrial cavity with questionable thickening of the endometrial, anterior wall, bilateral normal tubal ostia noted.  At this time, D and C is performed in a 4-quadrant method using sharp curettage.  Repeat visualization reveals an otherwise normal endometrial cavity.  The NovaSure device was then placed, it is seated to a length of 6.5 cm, a width of 4 cm and the CO2 test was performed and is negative.  The procedure was initiated to a power of 143 watts for 85 seconds.  At the termination of procedure, device was removed, inspected and found to be without evidence of defect.  Uterus was re- visualized and no evidence of uterine perforation noted and a well- ablated endometrial cavity is appreciated.  Good hemostasis was noted. Fluid deficit less than 50 mL.  The patient tolerated the procedure well, was awakened, transferred to recovery in good  condition.     Lenoard Aden, M.D.     RJT/MEDQ  D:  01/07/2012  T:  01/08/2012  Job:  [id number]

## 2012-04-01 ENCOUNTER — Other Ambulatory Visit: Payer: Self-pay | Admitting: Obstetrics and Gynecology

## 2012-04-01 DIAGNOSIS — Z1231 Encounter for screening mammogram for malignant neoplasm of breast: Secondary | ICD-10-CM

## 2012-05-09 ENCOUNTER — Ambulatory Visit
Admission: RE | Admit: 2012-05-09 | Discharge: 2012-05-09 | Disposition: A | Discharge status: Home / Self Care | Payer: BC Managed Care – PPO | Source: Ambulatory Visit | Attending: Obstetrics and Gynecology | Admitting: Obstetrics and Gynecology

## 2012-05-09 DIAGNOSIS — Z1231 Encounter for screening mammogram for malignant neoplasm of breast: Secondary | ICD-10-CM

## 2012-11-03 DIAGNOSIS — R102 Pelvic and perineal pain: Secondary | ICD-10-CM | POA: Insufficient documentation

## 2013-04-03 ENCOUNTER — Other Ambulatory Visit: Payer: Self-pay

## 2013-04-03 DIAGNOSIS — Z1231 Encounter for screening mammogram for malignant neoplasm of breast: Secondary | ICD-10-CM

## 2013-05-11 ENCOUNTER — Ambulatory Visit: Payer: BC Managed Care – PPO

## 2013-05-31 ENCOUNTER — Ambulatory Visit
Admission: RE | Admit: 2013-05-31 | Discharge: 2013-05-31 | Disposition: A | Discharge status: Home / Self Care | Payer: BC Managed Care – PPO | Source: Ambulatory Visit

## 2013-05-31 DIAGNOSIS — Z1231 Encounter for screening mammogram for malignant neoplasm of breast: Secondary | ICD-10-CM

## 2014-03-16 HISTORY — PX: BREAST REDUCTION SURGERY: SHX8

## 2014-11-08 ENCOUNTER — Other Ambulatory Visit: Payer: Self-pay | Admitting: Plastic Surgery

## 2015-03-21 MED FILL — ALPRAZolam 0.5 MG TABS: 0.5 | 10 days supply | Qty: 30 | Fill #0

## 2015-04-22 MED FILL — DILTIAZEM 24HR ER 240 MG CA: 240 | 90 days supply | Qty: 90 | Fill #3

## 2015-05-16 MED FILL — ALPRAZolam 0.5 MG TABS: 0.5 | 10 days supply | Qty: 30 | Fill #0

## 2015-05-20 MED FILL — ONDANSETRON HCL 8 MG TABLET: 8 | 7 days supply | Qty: 20 | Fill #0

## 2015-07-10 MED FILL — CARTIA XT 240 MG CAPSULE: 240 | 90 days supply | Qty: 90 | Fill #4

## 2015-07-11 MED FILL — ALPRAZolam 0.5 MG TABS: 0.5 | 10 days supply | Qty: 30 | Fill #0

## 2015-08-01 DIAGNOSIS — M79672 Pain in left foot: Secondary | ICD-10-CM | POA: Diagnosis not present

## 2015-08-09 DIAGNOSIS — Z1231 Encounter for screening mammogram for malignant neoplasm of breast: Secondary | ICD-10-CM | POA: Diagnosis not present

## 2015-08-09 DIAGNOSIS — Z1151 Encounter for screening for human papillomavirus (HPV): Secondary | ICD-10-CM | POA: Diagnosis not present

## 2015-08-09 DIAGNOSIS — Z01419 Encounter for gynecological examination (general) (routine) without abnormal findings: Secondary | ICD-10-CM | POA: Diagnosis not present

## 2015-08-09 DIAGNOSIS — Z6828 Body mass index (BMI) 28.0-28.9, adult: Secondary | ICD-10-CM | POA: Diagnosis not present

## 2015-08-19 DIAGNOSIS — M79672 Pain in left foot: Secondary | ICD-10-CM | POA: Diagnosis not present

## 2015-08-23 MED FILL — ALPRAZolam 0.5 MG TABS: 0.5 | 10 days supply | Qty: 30 | Fill #0

## 2015-10-04 MED FILL — CARTIA XT 240 MG CAPSULE: 240 | 90 days supply | Qty: 90 | Fill #0

## 2015-10-07 MED FILL — ALPRAZolam 0.5 MG TABS: 0.5 | 10 days supply | Qty: 30 | Fill #0

## 2015-11-04 DIAGNOSIS — D225 Melanocytic nevi of trunk: Secondary | ICD-10-CM | POA: Diagnosis not present

## 2015-11-04 DIAGNOSIS — L820 Inflamed seborrheic keratosis: Secondary | ICD-10-CM | POA: Diagnosis not present

## 2015-11-04 DIAGNOSIS — D2262 Melanocytic nevi of left upper limb, including shoulder: Secondary | ICD-10-CM | POA: Diagnosis not present

## 2015-11-04 DIAGNOSIS — D2261 Melanocytic nevi of right upper limb, including shoulder: Secondary | ICD-10-CM | POA: Diagnosis not present

## 2015-12-04 DIAGNOSIS — Z0000 Encounter for general adult medical examination without abnormal findings: Secondary | ICD-10-CM | POA: Diagnosis not present

## 2015-12-04 MED FILL — ALPRAZolam 0.5 MG TABS: 0.5 | 10 days supply | Qty: 30 | Fill #0

## 2015-12-11 DIAGNOSIS — J3089 Other allergic rhinitis: Secondary | ICD-10-CM | POA: Diagnosis not present

## 2015-12-11 DIAGNOSIS — Z0000 Encounter for general adult medical examination without abnormal findings: Secondary | ICD-10-CM | POA: Diagnosis not present

## 2015-12-11 DIAGNOSIS — Z8249 Family history of ischemic heart disease and other diseases of the circulatory system: Secondary | ICD-10-CM | POA: Diagnosis not present

## 2015-12-11 DIAGNOSIS — N946 Dysmenorrhea, unspecified: Secondary | ICD-10-CM | POA: Diagnosis not present

## 2015-12-11 DIAGNOSIS — Z23 Encounter for immunization: Secondary | ICD-10-CM | POA: Diagnosis not present

## 2015-12-11 DIAGNOSIS — Z1389 Encounter for screening for other disorder: Secondary | ICD-10-CM | POA: Diagnosis not present

## 2015-12-11 DIAGNOSIS — I10 Essential (primary) hypertension: Secondary | ICD-10-CM | POA: Diagnosis not present

## 2015-12-11 MED FILL — ATORVASTATIN 10 MG TABLET: 10 | 90 days supply | Qty: 90 | Fill #0

## 2015-12-11 MED FILL — VALSARTAN 40 MG TABLET: 40 | 90 days supply | Qty: 90 | Fill #0

## 2015-12-11 MED FILL — CARTIA XT 120 MG CAPSULE SA: 120 | 90 days supply | Qty: 90 | Fill #0

## 2016-01-01 ENCOUNTER — Ambulatory Visit (INDEPENDENT_AMBULATORY_CARE_PROVIDER_SITE_OTHER): Payer: BLUE CROSS/BLUE SHIELD | Admitting: Neurology

## 2016-01-01 ENCOUNTER — Encounter: Payer: Self-pay | Admitting: Neurology

## 2016-01-01 VITALS — BP 140/88 | HR 72 | Resp 20 | Ht 67.0 in | Wt 185.0 lb

## 2016-01-01 DIAGNOSIS — G4763 Sleep related bruxism: Secondary | ICD-10-CM | POA: Diagnosis not present

## 2016-01-01 DIAGNOSIS — G4733 Obstructive sleep apnea (adult) (pediatric): Secondary | ICD-10-CM | POA: Diagnosis not present

## 2016-01-01 DIAGNOSIS — M2619 Other specified anomalies of jaw-cranial base relationship: Secondary | ICD-10-CM | POA: Diagnosis not present

## 2016-01-01 DIAGNOSIS — R0683 Snoring: Secondary | ICD-10-CM | POA: Diagnosis not present

## 2016-01-01 DIAGNOSIS — I10 Essential (primary) hypertension: Secondary | ICD-10-CM

## 2016-01-01 DIAGNOSIS — E785 Hyperlipidemia, unspecified: Secondary | ICD-10-CM

## 2016-01-01 NOTE — Progress Notes (Signed)
SLEEP MEDICINE CLINIC   Provider:  Larey Seat, M D  Referring Provider: Crist Infante, MD Primary Care Physician:  Jerlyn Ly, MD  Chief Complaint  Patient presents with  . New Patient (Initial Visit)    snoring, large tonsils    HPI:  Ashley Edwards is a 45 y.o. female , seen here as a referral from Dr. 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 She is married with 3 children and also has a fourth child living with her. 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 She has not had any late shifts in several months now. She reports that she has no problems falling asleep, sometimes she will wake up from a choking or gasping sensation and her husband has taped her snoring, which led to this referral.   Sleep habits are as follows: She usually goes to bed between 9:30 10 PM and is promptly asleep, she always has a TV on by going to sleep. Her husband tends to go to bed later than her will turn the TV off when he comes to the bedroom, about an hour after her. Throughout the night the bedroom will be cool, quiet and dark. She always goes to sleep on her side but resumes a supine position later and this is how her husband actually videotaped her. Her snoring was quite loud, her breathing irregular with crescendo snoring. Her dog was in bed with her! She sleeps on 2 soft pillows does not have a very firm support. She rises usually at 5 AM and may have only one bathroom break at night -many nights none. If her sleep is interrupted she finds it not difficult to resume sleep. She feels refreshed and restored in the morning may have a dry mouth but is not complaining of headaches, diaphoresis, palpitations, and  rarely has heartburn.  Sleep medical history and family sleep history: neck surgery , C 4-6, after developing complete numbness and tingling in her  left hand. She is pain-free now. Dr. Sherley Bounds. Ashley Edwards has never undergone a tonsillectomy, adenoidectomy but she had an anterior neck fusion. In childhood she had no sleep disorders that she can recall, there was no sleepwalking, night terrors, no enuresis. 2 of her children had adenoid ectomy.   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 2 dogs . 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 She is a nonuser of tobacco, she drinks about 2-3 glasses of wine a week no hard liquor.   She noted alcohol impacts her sleep.  Exercise 5 days a week.    Review of Systems: Out of a complete 14 system review, the patient complains of only the following symptoms, and all other reviewed systems are negative.  Snoring with witnessed apneas in supine.   Epworth score  3, Fatigue severity score  15   , depression score n/a    Social History   Social History  . Marital status: Married    Spouse name: N/A  . Number of children: N/A  . Years of education: N/A   Occupational History  . Not on file.   Social History Main Topics  . Smoking status: Never Smoker  . Smokeless tobacco:  Not on file  . Alcohol use Yes     Comment: wine sociallly  . Drug use: No  . Sexual activity: Not on file   Other Topics Concern  . Not on file   Social History Narrative  . No narrative on file    No family history on file.  Past Medical History:  Diagnosis Date  . Hyperlipemia   . Hypertension   . Microalbuminuria     Past Surgical History:  Procedure Laterality Date  . CERVICAL DISCECTOMY    . DIAGNOSTIC LAPAROSCOPY    . DILATION AND CURETTAGE OF UTERUS      Current Outpatient Prescriptions  Medication Sig Dispense Refill  . atorvastatin (LIPITOR) 10 MG tablet Take 10 mg by mouth daily.    Marland Kitchen diltiazem (CARTIA XT) 120 MG 24 hr capsule Take 120 mg by mouth daily.     Marland Kitchen ibuprofen (ADVIL,MOTRIN) 200 MG tablet Take 600 mg by mouth every 6 (six) hours as needed. For pain    . mometasone (NASONEX) 50 MCG/ACT nasal spray Place 2 sprays into the nose daily as needed.     . Multiple Vitamin (MULTIVITAMIN WITH MINERALS) TABS Take 1 tablet by mouth daily.    . valsartan (DIOVAN) 40 MG tablet Take 40 mg by mouth daily.     No current facility-administered medications for this visit.     Allergies as of 01/01/2016 - Review Complete 01/01/2016  Allergen Reaction Noted  . Hydromorphone Swelling 12/18/2011    Vitals: BP 140/88   Pulse 72   Resp 20   Ht 5\' 7"  (1.702 m)   Wt 185 lb (83.9 kg)   BMI 28.98 kg/m  Last Weight:  Wt Readings from Last 1 Encounters:  01/01/16 185 lb (83.9 kg)   [id number] mass index is 28.98 kg/m.     Last Height:   Ht Readings from Last 1 Encounters:  01/01/16 5\' 7"  (1.702 m)    Physical exam:  General: The patient is awake, alert and appears not in acute distress. The patient is well groomed. Head: Normocephalic, atraumatic. Neck is supple. Mallampati 3, tip touches the tongue, large tonsils but not kissing.  neck circumference: 15. Nasal airflow , TMJ click from Bruxism.  Mild retrognathia is seen.  Cardiovascular:  Regular rate and rhythm, without  murmurs or carotid bruit, and without distended neck veins. Respiratory: Lungs are clear to auscultation. Skin:  Without evidence of edema, or rash Trunk: BMI is 29 . The patient's posture is erect   Neurologic exam : The patient is awake and alert, oriented to place and time.   Memory subjective  described as intact.  Attention span & concentration ability appears normal.  Speech is fluent,  without dysarthria, dysphonia or aphasia.  Mood and affect are appropriate.  Cranial nerves: Pupils are equal and briskly reactive to light. Funduscopic exam without evidence of pallor or edema. Extraocular movements  in vertical and horizontal planes intact and without nystagmus.  Visual fields by finger perimetry are intact. Hearing to finger rub intact.   Facial sensation intact to fine touch.  Facial motor strength is symmetric and tongue and uvula move midline. Shoulder shrug was symmetrical.   Motor exam:  Normal tone, muscle bulk and symmetric strength in all extremities.  Sensory:  Fine touch, pinprick and vibration were tested in all extremities. Proprioception tested in the upper extremities was normal.  Coordination: Finger-to-nose maneuver  normal without evidence of ataxia, dysmetria or tremor.  Gait and station: Patient walks  without assistive device and is able unassisted to climb up to the exam table. Strength within normal limits.  Stance is stable and normal.   Deep tendon reflexes: in the upper and lower extremities are symmetric and intact. Brisk.   The patient was advised of the nature of the diagnosed sleep disorder , the treatment options and risks for general a health and wellness arising from not treating the condition.  I spent more than 45  minutes of face to face time with the patient. Greater than 50% of time was spent in counseling and coordination of care. We have discussed the diagnosis and differential and I answered the patient's questions.     Assessment:  After physical and neurologic examination, review of laboratory studies,  Personal review of imaging studies, reports of other /same  Imaging studies ,  Results of polysomnography/ neurophysiology testing and pre-existing records as far as provided in visit., my assessment is   1) I will invite Mrs. Chappelle to a split-night polysomnography a on the high suspicion that she will have all as a. Her husband's video has convinced her that she indeed snores, and has some sleep disordered breathing. It has not reflected in daytime somnolence or fatigue and she has kept a regular exercise routine. She also has excellent sleep habits aside from her TV in the bedroom. But she keeps routine  times. She rarely drinks alcohol, and has noted that alcohol intake affects her sleep. She is a history of heartburn but treated with Pepcid. She has very mild retrognathia and a history of bruxism and I noted a TMJ click on her right jaw. If her apnea is very mild M.D. mainly resort to treat snoring we should consider a dental device for this patient.  RV after Sleep study.        Asencion Partridge Syed Zukas MD  01/01/2016   CC: Crist Infante, [address], [place] [address]

## 2016-01-01 NOTE — Patient Instructions (Signed)

## 2016-01-30 MED FILL — ALPRAZolam 0.5 MG TABS: 0.5 | 10 days supply | Qty: 30 | Fill #0

## 2016-02-18 ENCOUNTER — Telehealth: Payer: Self-pay | Admitting: Neurology

## 2016-02-18 DIAGNOSIS — G4730 Sleep apnea, unspecified: Secondary | ICD-10-CM

## 2016-02-18 NOTE — Telephone Encounter (Signed)
[employer] denied [place] and I got an authorization for HST.  Can I get an order for HST?

## 2016-02-18 NOTE — Telephone Encounter (Signed)
insurance denied in lab study, HST was ordered . CD

## 2016-03-04 MED FILL — VALSARTAN 40 MG TABLET: 40 | 90 days supply | Qty: 90 | Fill #1

## 2016-03-04 MED FILL — CARTIA XT 120 MG CAPSULE SA: 120 | 90 days supply | Qty: 90 | Fill #1

## 2016-03-04 MED FILL — ATORVASTATIN 10 MG TABLET: 10 | 90 days supply | Qty: 90 | Fill #1

## 2016-04-06 ENCOUNTER — Ambulatory Visit (INDEPENDENT_AMBULATORY_CARE_PROVIDER_SITE_OTHER): Payer: BLUE CROSS/BLUE SHIELD | Admitting: Neurology

## 2016-04-06 DIAGNOSIS — G4733 Obstructive sleep apnea (adult) (pediatric): Secondary | ICD-10-CM

## 2016-04-06 DIAGNOSIS — G4730 Sleep apnea, unspecified: Secondary | ICD-10-CM

## 2016-04-07 MED FILL — ALPRAZolam 0.5 MG TABS: 0.5 | 10 days supply | Qty: 30 | Fill #0

## 2016-04-09 ENCOUNTER — Telehealth: Payer: Self-pay | Admitting: Neurology

## 2016-04-09 NOTE — Procedures (Signed)
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 She reports that she has no problems falling asleep, staying asleep -and only sometimes will she wake up from a choking or gasping sensation. Her husband has taped her snoring, which led to this referral.   Her dog sleeps in bed with her.  Epworth score 3, Fatigue severity score 15 , BMI 29.  STUDY RESULTS:  Total Recording Time:  7h 37m  Total Apnea/Hypopnea Index (AHI) 1.8/hr.   Average Oxygen Desaturation: SpO2 at 95%  Lowest Oxygen desaturation: 86% with a total desaturation time of 1 minute.    IMPRESSION: No apnea of clinical significance was recorded , but moderate to severe snoring.  Heart rate remained in normal range, the patient experienced no prolonged desaturation.    RECOMMENDATION: Snoring treatment by dental device, weight loss and/ or ENT procedure. CPAP is not indicated. I certify that I have reviewed the raw data recording prior to the issuance of this report in accordance with the standards of Accreditation of the American Academy of Sleep medicine (AASM) Larey Seat, MD   04-09-2016 Diplomat, [place] of Psychiatry and Neurology

## 2016-04-09 NOTE — Telephone Encounter (Signed)
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 She reports that she has no problems falling asleep, staying asleep -and only sometimes will she wake up from a choking or gasping sensation. Her husband has taped her snoring, which led to this referral.  Epworth score 3, Fatigue severity score 15 , BMI 29.  STUDY RESULTS:  Total Recording Time:  7h 48m  Total Apnea/Hypopnea Index (AHI) 1.8/hr.   Average Oxygen Desaturation: SpO2 at 95%  Lowest Oxygen desaturation: 86% with a total desaturation time of 1 minute.    IMPRESSION: No apnea of clinical significance was recorded , but moderate to severe snoring.  Heart rate remained in normal range, the patient experienced no prolonged desaturation.  RECOMMENDATION; Snoring treatment by dental device, weight loss and/ or ENT procedure. CPAP is not indicated. I certify that I have reviewed the raw data recording prior to the issuance of this report in accordance with the standards of Accreditation of the American Academy of Sleep medicine (AASM) Larey Seat, MD   04-09-2016 Diplomat, American Board of Psychiatry and Neurology Diplomat, American Board of Sleep Medicine Medical Director of [facility] [employer]

## 2016-04-14 NOTE — Telephone Encounter (Signed)
I called pt. I advised her that her sleep study results revealed no significant apnea but did show moderate to severe snoring. Dr. Brett Fairy does recommend treatment by a dental device, weight loss, and/or ENT procedure. CPAP is not indicated. Pt says that she will check with her dentist to find out if they do dental devices for snoring. If not, she will call our office back. Pt asked that I send these results to Dr. Joylene Draft. Pt declined a follow up at this time with Dr. Brett Fairy. Pt verbalized understanding of results. Pt had no questions at this time but was encouraged to call back if questions arise.

## 2016-05-15 DIAGNOSIS — H25013 Cortical age-related cataract, bilateral: Secondary | ICD-10-CM | POA: Diagnosis not present

## 2016-05-15 DIAGNOSIS — H43813 Vitreous degeneration, bilateral: Secondary | ICD-10-CM | POA: Diagnosis not present

## 2016-05-15 DIAGNOSIS — H5213 Myopia, bilateral: Secondary | ICD-10-CM | POA: Diagnosis not present

## 2016-05-15 DIAGNOSIS — H04123 Dry eye syndrome of bilateral lacrimal glands: Secondary | ICD-10-CM | POA: Diagnosis not present

## 2016-06-02 MED FILL — CARTIA XT 120 MG CAPSULE SA: 120 | 90 days supply | Qty: 90 | Fill #2

## 2016-06-02 MED FILL — ATORVASTATIN 10 MG TABLET: 10 | 90 days supply | Qty: 90 | Fill #2

## 2016-06-02 MED FILL — VALSARTAN 40 MG TABLET: 40 | 90 days supply | Qty: 90 | Fill #2

## 2016-06-03 MED FILL — ALPRAZolam 0.5 MG TABS: 0.5 | 10 days supply | Qty: 30 | Fill #0

## 2016-08-13 MED FILL — ALPRAZolam 0.5 MG TABS: 0.5 | 10 days supply | Qty: 30 | Fill #0

## 2016-09-01 MED FILL — VALSARTAN 40 MG TABLET: 40 | 90 days supply | Qty: 90 | Fill #3

## 2016-09-01 MED FILL — ATORVASTATIN 10 MG TABLET: 10 | 90 days supply | Qty: 90 | Fill #3

## 2016-09-01 MED FILL — CARTIA XT 120 MG CAPSULE SA: 120 | 90 days supply | Qty: 90 | Fill #3

## 2016-09-22 MED FILL — ONDANSETRON HCL 4 MG TABLET: 4 | 7 days supply | Qty: 30 | Fill #0

## 2016-09-22 MED FILL — CIPROFLOXACIN HCL 500 MG TA: 500 | 7 days supply | Qty: 14 | Fill #0

## 2016-10-13 MED FILL — TELMISARTAN 20 MG TABLET: 20 | 90 days supply | Qty: 90 | Fill #0

## 2016-10-13 MED FILL — ALPRAZolam 0.5 MG TABS: 0.5 | 10 days supply | Qty: 30 | Fill #0

## 2016-10-29 DIAGNOSIS — Z01419 Encounter for gynecological examination (general) (routine) without abnormal findings: Secondary | ICD-10-CM | POA: Diagnosis not present

## 2016-10-29 DIAGNOSIS — Z1231 Encounter for screening mammogram for malignant neoplasm of breast: Secondary | ICD-10-CM | POA: Diagnosis not present

## 2016-10-29 DIAGNOSIS — Z6828 Body mass index (BMI) 28.0-28.9, adult: Secondary | ICD-10-CM | POA: Diagnosis not present

## 2016-12-03 MED FILL — CARTIA XT 120 MG CAPSULE SA: 120 | 90 days supply | Qty: 90 | Fill #4

## 2016-12-03 MED FILL — ATORVASTATIN 10 MG TABLET: 10 | 90 days supply | Qty: 90 | Fill #4

## 2017-01-01 DIAGNOSIS — Z23 Encounter for immunization: Secondary | ICD-10-CM | POA: Diagnosis not present

## 2017-01-06 MED FILL — ALPRAZolam 0.5 MG TABS: 0.5 | 10 days supply | Qty: 30 | Fill #0

## 2017-01-07 MED FILL — TELMISARTAN 20 MG TABLET: 20 | 90 days supply | Qty: 90 | Fill #1

## 2017-02-02 DIAGNOSIS — I10 Essential (primary) hypertension: Secondary | ICD-10-CM | POA: Diagnosis not present

## 2017-02-02 DIAGNOSIS — Z0000 Encounter for general adult medical examination without abnormal findings: Secondary | ICD-10-CM | POA: Diagnosis not present

## 2017-02-02 DIAGNOSIS — R82998 Other abnormal findings in urine: Secondary | ICD-10-CM | POA: Diagnosis not present

## 2017-02-08 DIAGNOSIS — Z0000 Encounter for general adult medical examination without abnormal findings: Secondary | ICD-10-CM | POA: Diagnosis not present

## 2017-02-08 DIAGNOSIS — E7849 Other hyperlipidemia: Secondary | ICD-10-CM | POA: Diagnosis not present

## 2017-02-08 DIAGNOSIS — R808 Other proteinuria: Secondary | ICD-10-CM | POA: Diagnosis not present

## 2017-02-08 DIAGNOSIS — Z8249 Family history of ischemic heart disease and other diseases of the circulatory system: Secondary | ICD-10-CM | POA: Diagnosis not present

## 2017-02-08 DIAGNOSIS — Z1389 Encounter for screening for other disorder: Secondary | ICD-10-CM | POA: Diagnosis not present

## 2017-02-08 DIAGNOSIS — R0683 Snoring: Secondary | ICD-10-CM | POA: Diagnosis not present

## 2017-03-01 MED FILL — CARTIA XT 120 MG CAPSULE SA: 120 | 90 days supply | Qty: 90 | Fill #0

## 2017-03-01 MED FILL — metroNIDAZOLE 500 MG TABS: 500 | 7 days supply | Qty: 14 | Fill #0

## 2017-03-01 MED FILL — FLUCONAZOLE 150 MG TABLET: 150 | 7 days supply | Qty: 3 | Fill #0

## 2017-03-02 MED FILL — TERCONAZOLE 0.8% VAGINAL CR: 0.8 | 3 days supply | Qty: 20 | Fill #0

## 2017-03-02 MED FILL — SULFAMETHOXAZOLE/TMP DS TAB: 800-160 | 3 days supply | Qty: 6 | Fill #0

## 2017-03-02 MED FILL — ALPRAZolam 0.5 MG TABS: 0.5 | 10 days supply | Qty: 30 | Fill #0

## 2017-03-05 DIAGNOSIS — R3915 Urgency of urination: Secondary | ICD-10-CM | POA: Diagnosis not present

## 2017-03-05 MED FILL — TAMSULOSIN HCL 0.4 MG CAP: 0.4 | 30 days supply | Qty: 30 | Fill #0

## 2017-03-08 MED FILL — predniSONE 10 MG TABS: 10 | 6 days supply | Qty: 21 | Fill #0

## 2017-03-08 MED FILL — AMOX-CLAV 875-125 MG TABLET: 875-125 | 7 days supply | Qty: 14 | Fill #0

## 2017-03-25 MED FILL — ATORVASTATIN 10 MG TABLET: 10 | 90 days supply | Qty: 90 | Fill #0

## 2017-04-12 DIAGNOSIS — N390 Urinary tract infection, site not specified: Secondary | ICD-10-CM | POA: Diagnosis not present

## 2017-04-12 DIAGNOSIS — R3915 Urgency of urination: Secondary | ICD-10-CM | POA: Diagnosis not present

## 2017-04-12 MED FILL — CEFDINIR 300 MG CAPSULE: 300 | 5 days supply | Qty: 10 | Fill #0

## 2017-04-15 DIAGNOSIS — N202 Calculus of kidney with calculus of ureter: Secondary | ICD-10-CM | POA: Diagnosis not present

## 2017-04-15 DIAGNOSIS — N200 Calculus of kidney: Secondary | ICD-10-CM | POA: Diagnosis not present

## 2017-04-15 DIAGNOSIS — N281 Cyst of kidney, acquired: Secondary | ICD-10-CM | POA: Diagnosis not present

## 2017-04-15 MED FILL — TAMSULOSIN HCL 0.4 MG CAP: 0.4 | 30 days supply | Qty: 30 | Fill #0

## 2017-04-22 MED FILL — ALPRAZolam 0.5 MG TABS: 0.5 | 10 days supply | Qty: 30 | Fill #0

## 2017-05-03 DIAGNOSIS — N202 Calculus of kidney with calculus of ureter: Secondary | ICD-10-CM | POA: Diagnosis not present

## 2017-05-03 DIAGNOSIS — N200 Calculus of kidney: Secondary | ICD-10-CM | POA: Diagnosis not present

## 2017-05-14 DIAGNOSIS — N200 Calculus of kidney: Secondary | ICD-10-CM | POA: Diagnosis not present

## 2017-05-26 MED FILL — CARTIA XT 120 MG CAPSULE SA: 120 | 90 days supply | Qty: 90 | Fill #1

## 2017-06-03 DIAGNOSIS — N200 Calculus of kidney: Secondary | ICD-10-CM | POA: Diagnosis not present

## 2017-06-03 DIAGNOSIS — R1909 Other intra-abdominal and pelvic swelling, mass and lump: Secondary | ICD-10-CM | POA: Diagnosis not present

## 2017-06-14 MED FILL — ALPRAZolam 0.5 MG TABS: 0.5 | 10 days supply | Qty: 30 | Fill #0

## 2017-06-15 MED FILL — ATORVASTATIN 10 MG TABLET: 10 | 90 days supply | Qty: 90 | Fill #1

## 2017-07-20 MED FILL — ALPRAZolam 0.5 MG TABS: 0.5 | 10 days supply | Qty: 30 | Fill #0

## 2017-08-18 MED FILL — CARTIA XT 120 MG CAPSULE SA: 120 | 90 days supply | Qty: 90 | Fill #2

## 2017-08-19 MED FILL — ALPRAZolam 0.5 MG TABS: 0.5 | 10 days supply | Qty: 30 | Fill #0

## 2017-08-31 DIAGNOSIS — R1084 Generalized abdominal pain: Secondary | ICD-10-CM | POA: Diagnosis not present

## 2017-09-01 ENCOUNTER — Encounter (HOSPITAL_COMMUNITY): Payer: Self-pay | Admitting: General Practice

## 2017-09-01 ENCOUNTER — Other Ambulatory Visit: Payer: Self-pay | Admitting: Urology

## 2017-09-01 NOTE — H&P (Signed)
Office Visit Report     08/31/2017    Ashley Edwards         MRN: [id number]  PRIMARY CARE:  Jeannette How. Perini, MD  DOB: [birth year], 47 year old Female  REFERRING:    SSN: [id number]  PROVIDER:  Kathie Rhodes, M.D.    TREATING:  Jimmey Ralph    LOCATION:  [facility] [phone]    CC: I have pain in the flank.  HPI: Ashley Edwards is a 47 year-old female established patient who is here for flank pain.  She has known bilateral stones and feels like she has either passed the stone or is still passing. She continues to have some mild LLQ discomfort and mild dysuria. Denies seeing a stone but has passed some tiny flecks of sand.    The problem is on the left side. Her pain started about approximately 08/21/2017. The pain is dull. The pain is intermittent. The pain does radiate.   Lying down< makes the pain better. Nothing causes the pain to become worse. She has not been treated with any pain medications.   ALLERGIES: hydromorphone   MEDICATIONS: Lipitor  Lipitor 10 mg tablet  Diltiazem 24Hr Er  Micardis    GU PSH: None   NON-GU PSH: Neck Surgery Remove Ovary(s)   GU PMH: Renal calculus (Stable), Bilateral, She is going to increase her fluid intake. I will monitor her bilateral renal calculi with a follow-up KUB again in 6 months. - 06/03/2017, (Stable), She has bilateral renal calculi. I wanted to get blood work at her last visit but she got out of the office before I could put the order in so that scan to be done today and I am also going to have her do a 24 hr urine for stone risk. She will then return to go over the results of her blood work, 24 hr urine and stone analysis., - 05/03/2017, - 04/15/2017, Bilateral, She has bilateral renal calculi. I told her we need to evaluate her with serum and urine studies but I did not want to get the 24 hr urine currently because she is actively passing a stone., - 04/15/2017 Microscopic hematuria, Her microscopic hematuria that was  seen at her primary care physician's office appears to have been from her left distal ureteral stone. - 04/15/2017 Renal cyst, Right - 04/15/2017 Ureteral calculus (Acute), Left, She is a 4 mm stone in her distal left ureter. It is causing some mild left hydronephrosis but she is not having any significant pain so she is going to undergo medical expulsive therapy and we discussed surgical therapy as an option if she does not eventually pass the stone. - 04/15/2017 Ureteral obstruction secondary to calculous (Acute), Left, She is not having a lot of pain from her stone so she has elected to proceed with medical expulsive therapy. She is being bothered by a lot of irritative symptoms so I gave her samples of Myrbetriq to help with those symptoms. - 04/15/2017   NON-GU PMH: Other intra-abdominal and pelvic swelling, mass and lump, Right, It seems that her right groin mass appears to be intimately associated with her menstrual cycle. I called over and asked her gynecologist, Dr. Ronita Hipps, to take a look at her CT scan. She has an appointment with him in August. It sounds as if this has been going on for quite some time and likely is a benign finding. - 06/03/2017 GERD Hypercholesterolemia Hypertension   FAMILY HISTORY: Blood In Urine -  Brother, Father CABG - Father Hypertension - Father nephrolithiasis - Brother, Father   SOCIAL HISTORY: Marital Status: Married Preferred Language: English; Ethnicity: Not Hispanic Or Latino; Race: White Current Smoking Status: Patient has never smoked.   Tobacco Use Assessment Completed:  Used Tobacco in last 30 days?    REVIEW OF SYSTEMS:    GU Review Female:   Patient reports frequent urination and burning /pain with urination. Patient denies hard to postpone urination, get up at night to urinate, leakage of urine, stream starts and stops, trouble starting your stream, have to strain to urinate, and being pregnant.  Gastrointestinal (Upper):   Patient denies  indigestion/ heartburn, vomiting, and nausea.  Gastrointestinal (Lower):   Patient denies diarrhea and constipation.  Constitutional:   Patient denies fever, night sweats, weight loss, and fatigue.  Skin:   Patient denies skin rash/ lesion and itching.  Eyes:   Patient denies blurred vision and double vision.  Ears/ Nose/ Throat:   Patient denies sore throat and sinus problems.  Hematologic/Lymphatic:   Patient denies swollen glands and easy bruising.  Cardiovascular:   Patient denies leg swelling and chest pains.  Respiratory:   Patient denies cough and shortness of breath.  Endocrine:   Patient denies excessive thirst.  Musculoskeletal:   Patient denies back pain and joint pain.  Neurological:   Patient denies headaches and dizziness.  Psychologic:   Patient denies depression and anxiety.   VITAL SIGNS:      08/31/2017 11:30 AM  Weight 170 lb / 77.11 kg  Height 67.5 in / 171.45 cm  BP 153/101 mmHg  Pulse 96 /min  Temperature 98.7 F / 37.0 C  BMI 26.2 kg/m   MULTI-SYSTEM PHYSICAL EXAMINATION:    Constitutional: Well-nourished. No physical deformities. Normally developed. Good grooming.   Respiratory: No labored breathing, no use of accessory muscles. Normal breath sounds.   Cardiovascular: Regular rate and rhythm. No murmur, no gallop. Normal temperature, normal extremity pulses, no swelling, no varicosities.   Skin: No paleness, no jaundice, no cyanosis. No lesion, no ulcer, no rash.   Neurologic / Psychiatric: Oriented to time, oriented to place, oriented to person. No depression, no anxiety, no agitation.   Gastrointestinal: No mass, no tenderness, no rigidity, non obese abdomen. No CVAT  Musculoskeletal: Normal gait and station of head and neck.    PAST DATA REVIEWED:  Source Of History:  Patient  Lab Test Review:   Hypercalciura Profile  Records Review:   Previous Patient Records  Urine Test Review:   Urinalysis  X-Ray Review: C.T. Stone Protocol: Reviewed Report. 1/31/  2019: IMPRESSION: 1. Bilateral nephrolithiasis with a 6 x 5 x 6 mm distal left ureteral stone causing mild fullness in the left ureter. 2. Right renal cyst. 3. 2 x 3 cm focus of soft tissue attenuation in the right groin. This does not appear to be a lymph node and may be related to a groin hernia. Complex fluid in a hernia sac, endometriosis in a hernia sac or prior herniorrhaphy would be considerations. Follow-up CT in 3 months is recommended to ensure that this remains stable.    05/03/17  General Chemistry  Sodium 140 mEq/L  Potassium 3.8 mEq/L  Creatinine 0.7 mg/dL  Chloride 102 mEq/L  CO2 23 mEq/L  Calcium 9.3 mg/dL  eGFR African American 116.8   eGFR Non-Afr. American 96.3   Uric Acid 2.7 mg/dL  Phosphorus 4.3 mg/dL   PROCEDURES:         KUB - 09381  A single view of the abdomen is obtained.      Stable right renal stone (1) left renal stones (2). No definitive bilateral ureteral calculus noted. Stable left pelvic calcification noted. This KUB was compared to her previous CT scout and remains unchanged.         Urinalysis w/Scope - 81001 Dipstick Dipstick Cont'd Micro  Specimen: Voided Bilirubin: Neg WBC/hpf: NS (Not Seen)  Color: Yellow Ketones: Neg RBC/hpf: 0 - 2/hpf  Appearance: Clear Blood: Trace Bacteria: NS (Not Seen)  Specific Gravity: 1.015 Protein: Neg Cystals: NS (Not Seen)  pH: 6.0 Urobilinogen: 0.2 Casts: NS (Not Seen)  Glucose: Neg Nitrites: Neg Trichomonas: Not Present    Leukocyte Esterase: Neg Mucous: Not Present      Epithelial Cells: NS (Not Seen)      Yeast: NS (Not Seen)      Sperm: Not Present   ASSESSMENT:      ICD-10 Details  1 GU:   Flank Pain - R10.84 Left, Acute - No acute explanation of flank pain. She did not want to pursue further imaging at this time. She will f/u w/GYN about ? (R) groin mass in Aug. She did not want any pain medication. She understands if she has any acute changes ie intractable pain, temp >100.5, or gross hematuria will need  CT urogram.    PLAN:          Orders X-Rays: KUB         Schedule Return Visit/Planned Activity: Return PRN         Document Letter(s):  Created for Patient: Clinical Summary   * Signed by Jimmey Ralph on 08/31/17 at 12:29 PM (EDT)*        APPENDED NOTES:  Dr. Junious Silk reviewed KUB and he feels that distal pelvic calcification is a ureteral stone which has dropped from left renal pelvis. She is aware of situation and at this time she would prefer to proceed with MET. She understands if she has any acute changes to notify office and she will begin Tamsulosin daily.    * Signed by Jimmey Ralph on 08/31/17 at 3:51 PM (EDT)*     I spoke to [facility] and it appears the left mid pole stone has dropped. It is now on the left distal ureter and this is where she continues to have discomfort. She's had discomfort for 10-12 days. I went over with her the nature risks and benefits of continued surveillance, off label use of alpha blockers, ureteroscopy and shockwave. We discussed expected success rate and need for staged procedure. She wants to do shockwave and we discussed this will only treat the distal stone not the other stones. We discussed risk of bleeding, infection and Steinstrasse requiring ureteroscopy and stent among others. All questions answered and she elects to proceed with shockwave. She has not had dysuria or fever.    * Signed by Festus Aloe, M.D. on 08/31/17 at 4:45 PM (EDT)*     The information contained in this medical record document is considered private and confidential patient information. This information can only be used for the medical diagnosis and/or medical services that are being provided by the patient's selected caregivers. This information can only be distributed outside of the patient's care if the patient agrees and signs waivers of authorization for this information to be sent to an outside source or route.

## 2017-09-02 ENCOUNTER — Encounter (HOSPITAL_COMMUNITY)
Admission: RE | Disposition: A | Discharge status: Home / Self Care | Payer: Self-pay | Source: Ambulatory Visit | Attending: Urology

## 2017-09-02 ENCOUNTER — Encounter (HOSPITAL_COMMUNITY): Payer: Self-pay

## 2017-09-02 ENCOUNTER — Ambulatory Visit (HOSPITAL_COMMUNITY)
Admission: RE | Admit: 2017-09-02 | Discharge: 2017-09-02 | Disposition: A | Discharge status: Home / Self Care | Payer: BLUE CROSS/BLUE SHIELD | Source: Ambulatory Visit | Attending: Urology | Admitting: Urology

## 2017-09-02 ENCOUNTER — Ambulatory Visit (HOSPITAL_COMMUNITY): Payer: BLUE CROSS/BLUE SHIELD

## 2017-09-02 DIAGNOSIS — N202 Calculus of kidney with calculus of ureter: Secondary | ICD-10-CM | POA: Diagnosis not present

## 2017-09-02 DIAGNOSIS — N201 Calculus of ureter: Secondary | ICD-10-CM | POA: Insufficient documentation

## 2017-09-02 DIAGNOSIS — Z87442 Personal history of urinary calculi: Secondary | ICD-10-CM | POA: Insufficient documentation

## 2017-09-02 DIAGNOSIS — Z79899 Other long term (current) drug therapy: Secondary | ICD-10-CM | POA: Insufficient documentation

## 2017-09-02 DIAGNOSIS — Z885 Allergy status to narcotic agent status: Secondary | ICD-10-CM | POA: Diagnosis not present

## 2017-09-02 HISTORY — DX: Personal history of urinary calculi: Z87.442

## 2017-09-02 HISTORY — PX: EXTRACORPOREAL SHOCK WAVE LITHOTRIPSY: SHX1557

## 2017-09-02 LAB — PREGNANCY, URINE: Preg Test, Ur: NEGATIVE

## 2017-09-02 SURGERY — LITHOTRIPSY, ESWL
Anesthesia: LOCAL | Laterality: Left

## 2017-09-02 MED ORDER — DIPHENHYDRAMINE HCL 25 MG PO CAPS
25.0000 mg | ORAL_CAPSULE | ORAL | Status: AC
  Administered 2017-09-02: 25 mg via ORAL
  Filled 2017-09-02: qty 1

## 2017-09-02 MED ORDER — IBUPROFEN 200 MG PO TABS: 600.0000 mg | ORAL_TABLET | Freq: Four times a day (QID) | ORAL | 0 refills | Status: DC | PRN

## 2017-09-02 MED ORDER — DIAZEPAM 5 MG PO TABS
10.0000 mg | ORAL_TABLET | ORAL | Status: AC
  Administered 2017-09-02: 10 mg via ORAL
  Filled 2017-09-02: qty 2

## 2017-09-02 MED ORDER — CIPROFLOXACIN HCL 500 MG PO TABS
500.0000 mg | ORAL_TABLET | ORAL | Status: AC
  Administered 2017-09-02: 500 mg via ORAL
  Filled 2017-09-02: qty 1

## 2017-09-02 MED ORDER — SODIUM CHLORIDE 0.9 % IV SOLN
INTRAVENOUS | Status: DC
  Administered 2017-09-02: 10:00:00 via INTRAVENOUS

## 2017-09-02 NOTE — Op Note (Signed)
Left ESWL  Left distal stone , 6 mm   Findings: stone broke and then main component faded but remained. Rate slowed. She may need a staged procedure.

## 2017-09-02 NOTE — Interval H&P Note (Signed)
History and Physical Interval Note:  09/02/2017 11:55 AM  Ashley Edwards  has presented today for surgery, with the diagnosis of left distal stone  The various methods of treatment have been discussed with the patient and family. After consideration of risks, benefits and other options for treatment, the patient has consented to  Procedure(s): LEFT EXTRACORPOREAL SHOCK WAVE LITHOTRIPSY (ESWL) (Left) as a surgical intervention .  The patient's history has been reviewed, patient examined, no change in status, stable for surgery. Pt with left flank and now LLQ pain since 6/8. Left MP stone appears to have dropped and in left distal ureter. Lines up appropriately under flouro c/w distal stone. Pt well. No fever or dysuria. Discussed again in pre-op nature r/b/a to eswl with pt and husband. I have reviewed the patient's chart and labs. Stone stable LLQ on KUB.  Questions were answered to the patient's satisfaction.     Festus Aloe

## 2017-09-02 NOTE — Discharge Instructions (Signed)
Lithotripsy, Care After °This sheet gives you information about how to care for yourself after your procedure. Your health care provider may also give you more specific instructions. If you have problems or questions, contact your health care provider. °What can I expect after the procedure? °After the procedure, it is common to have: °· Some blood in your urine. This should only last for a few days. °· Soreness in your back, sides, or upper abdomen for a few days. °· Blotches or bruises on your back where the pressure wave entered the skin. °· Pain, discomfort, or nausea when pieces (fragments) of the kidney stone move through the tube that carries urine from the kidney to the bladder (ureter). Stone fragments may pass soon after the procedure, but they may continue to pass for up to 4-8 weeks. °? If you have severe pain or nausea, contact your health care provider. This may be caused by a large stone that was not broken up, and this may mean that you need more treatment. °· Some pain or discomfort during urination. °· Some pain or discomfort in the lower abdomen or (in men) at the base of the penis. ° °Follow these instructions at home: °Medicines °· Take over-the-counter and prescription medicines only as told by your health care provider. °· If you were prescribed an antibiotic medicine, take it as told by your health care provider. Do not stop taking the antibiotic even if you start to feel better. °· Do not drive for 24 hours if you were given a medicine to help you relax (sedative). °· Do not drive or use heavy machinery while taking prescription pain medicine. °Eating and drinking °· Drink enough water and fluids to keep your urine clear or pale yellow. This helps any remaining pieces of the stone to pass. It can also help prevent new stones from forming. °· Eat plenty of fresh fruits and vegetables. °· Follow instructions from your health care provider about eating and drinking restrictions. You may be  instructed: °? To reduce how much salt (sodium) you eat or drink. Check ingredients and nutrition facts on packaged foods and beverages. °? To reduce how much meat you eat. °· Eat the recommended amount of calcium for your age and gender. Ask your health care provider how much calcium you should have. °General instructions °· Get plenty of rest. °· Most people can resume normal activities 1-2 days after the procedure. Ask your health care provider what activities are safe for you. °· If directed, strain all urine through the strainer that was provided by your health care provider. °? Keep all fragments for your health care provider to see. Any stones that are found may be sent to a medical lab for examination. The stone may be as small as a grain of salt. °· Keep all follow-up visits as told by your health care provider. This is important. °Contact a health care provider if: °· You have pain that is severe or does not get better with medicine. °· You have nausea that is severe or does not go away. °· You have blood in your urine longer than your health care provider told you to expect. °· You have more blood in your urine. °· You have pain during urination that does not go away. °· You urinate more frequently than usual and this does not go away. °· You develop a rash or any other possible signs of an allergic reaction. °Get help right away if: °· You have severe pain in   your back, sides, or upper abdomen. °· You have severe pain while urinating. °· Your urine is very dark red. °· You have blood in your stool (feces). °· You cannot pass any urine at all. °· You feel a strong urge to urinate after emptying your bladder. °· You have a fever or chills. °· You develop shortness of breath, difficulty breathing, or chest pain. °· You have severe nausea that leads to persistent vomiting. °· You faint. °Summary °· After this procedure, it is common to have some pain, discomfort, or nausea when pieces (fragments) of the  kidney stone move through the tube that carries urine from the kidney to the bladder (ureter). If this pain or nausea is severe, however, you should contact your health care provider. °· Most people can resume normal activities 1-2 days after the procedure. Ask your health care provider what activities are safe for you. °· Drink enough water and fluids to keep your urine clear or pale yellow. This helps any remaining pieces of the stone to pass, and it can help prevent new stones from forming. °· If directed, strain your urine and keep all fragments for your health care provider to see. Fragments or stones may be as small as a grain of salt. °· Get help right away if you have severe pain in your back, sides, or upper abdomen or have severe pain while urinating. °This information is not intended to replace advice given to you by your health care provider. Make sure you discuss any questions you have with your health care provider. °Document Released: 03/22/2007 Document Revised: 01/22/2016 Document Reviewed: 01/22/2016 °Elsevier Interactive Patient Education © 2018 [employer] ° °

## 2017-09-03 ENCOUNTER — Encounter (HOSPITAL_COMMUNITY): Payer: Self-pay | Admitting: Urology

## 2017-09-20 DIAGNOSIS — N200 Calculus of kidney: Secondary | ICD-10-CM | POA: Diagnosis not present

## 2017-09-21 MED FILL — ATORVASTATIN 10 MG TABLET: 10 | 90 days supply | Qty: 90 | Fill #2

## 2017-09-23 MED FILL — ALPRAZolam 0.5 MG TABS: 0.5 | 10 days supply | Qty: 30 | Fill #0

## 2017-11-01 MED FILL — ALPRAZolam 0.5 MG TABS: 0.5 | 10 days supply | Qty: 30 | Fill #0

## 2017-11-09 DIAGNOSIS — Z01419 Encounter for gynecological examination (general) (routine) without abnormal findings: Secondary | ICD-10-CM | POA: Diagnosis not present

## 2017-11-09 DIAGNOSIS — Z1231 Encounter for screening mammogram for malignant neoplasm of breast: Secondary | ICD-10-CM | POA: Diagnosis not present

## 2017-11-09 DIAGNOSIS — Z6827 Body mass index (BMI) 27.0-27.9, adult: Secondary | ICD-10-CM | POA: Diagnosis not present

## 2017-11-10 MED FILL — CARTIA XT 120 MG CAPSULE SA: 120 | 90 days supply | Qty: 90 | Fill #3

## 2017-11-11 MED FILL — TELMISARTAN 20 MG TABLET: 20 | 90 days supply | Qty: 90 | Fill #0

## 2017-11-30 DIAGNOSIS — N200 Calculus of kidney: Secondary | ICD-10-CM | POA: Diagnosis not present

## 2017-12-01 DIAGNOSIS — R1909 Other intra-abdominal and pelvic swelling, mass and lump: Secondary | ICD-10-CM | POA: Diagnosis not present

## 2017-12-01 DIAGNOSIS — R1030 Lower abdominal pain, unspecified: Secondary | ICD-10-CM | POA: Diagnosis not present

## 2017-12-13 MED FILL — ALPRAZolam 0.5 MG TABS: 0.5 | 10 days supply | Qty: 30 | Fill #0

## 2017-12-14 MED FILL — EPINEPHRINE 0.3 MG AUTO-INJ: 0.3 | 30 days supply | Qty: 2 | Fill #0

## 2017-12-23 DIAGNOSIS — I10 Essential (primary) hypertension: Secondary | ICD-10-CM | POA: Diagnosis not present

## 2017-12-23 DIAGNOSIS — K4090 Unilateral inguinal hernia, without obstruction or gangrene, not specified as recurrent: Secondary | ICD-10-CM | POA: Diagnosis not present

## 2017-12-23 DIAGNOSIS — F419 Anxiety disorder, unspecified: Secondary | ICD-10-CM | POA: Diagnosis not present

## 2017-12-23 DIAGNOSIS — E785 Hyperlipidemia, unspecified: Secondary | ICD-10-CM | POA: Diagnosis not present

## 2017-12-23 DIAGNOSIS — N808 Other endometriosis: Secondary | ICD-10-CM | POA: Diagnosis not present

## 2017-12-23 DIAGNOSIS — R1909 Other intra-abdominal and pelvic swelling, mass and lump: Secondary | ICD-10-CM | POA: Diagnosis not present

## 2017-12-31 DIAGNOSIS — Z23 Encounter for immunization: Secondary | ICD-10-CM | POA: Diagnosis not present

## 2018-01-04 MED FILL — NORETHINDRONE 0.35 MG TAB: 0.35 | 28 days supply | Qty: 28 | Fill #0

## 2018-01-07 ENCOUNTER — Encounter: Payer: Self-pay | Admitting: Obstetrics & Gynecology

## 2018-01-07 ENCOUNTER — Ambulatory Visit (INDEPENDENT_AMBULATORY_CARE_PROVIDER_SITE_OTHER): Payer: BLUE CROSS/BLUE SHIELD | Admitting: Obstetrics & Gynecology

## 2018-01-07 VITALS — BP 140/81 | HR 91 | Ht 67.5 in | Wt 169.6 lb

## 2018-01-07 DIAGNOSIS — N762 Acute vulvitis: Secondary | ICD-10-CM

## 2018-01-07 DIAGNOSIS — E7800 Pure hypercholesterolemia, unspecified: Secondary | ICD-10-CM

## 2018-01-07 DIAGNOSIS — T8149XA Infection following a procedure, other surgical site, initial encounter: Secondary | ICD-10-CM

## 2018-01-07 NOTE — Progress Notes (Signed)
   Subjective:    Patient ID: Ashley Edwards, female    DOB: December 22, 1970, 47 y.o.   MRN: [id number]  HPI  47 yo female presents for evaluation of right inguinal/labial cellulitis.  Pt had a diagnostic laparoscopy at [facility] for hernia.  There was no hernia in the right inguinal region.  The mass was found to be endometriosis.  GYN was called to the room and the abdominal wall endometriosis implant was removed.  Pt was discharged in good condition.  Pt started developing inguinal swelling and redness extending into the right labia majora.  No fever.  No discharge/fluctuant area.  I saw patient on October 23rd at another facility and started Bactrim DS 2 tabs bid for 10 days.  Pt has improved some over 2 days.  Decrease in erythema.     Review of Systems  Constitutional: Negative for fever.  Respiratory: Negative.   Cardiovascular: Negative.   Gastrointestinal: Negative.   Genitourinary:       +Vulvar pain (right) +inguinal swelling and pain  Neurological: Negative.   Psychiatric/Behavioral: Negative.        Objective:   Physical Exam  Constitutional: She is oriented to person, place, and time. She appears well-developed and well-nourished. No distress.  HENT:  Head: Normocephalic and atraumatic.  Eyes: Conjunctivae are normal.  Cardiovascular: Normal rate.  Pulmonary/Chest: Effort normal.  Abdominal:    Genitourinary:     Musculoskeletal: She exhibits no edema.  Neurological: She is alert and oriented to person, place, and time.  Skin: Skin is warm and dry.  Psychiatric: She has a normal mood and affect.  Vitals reviewed.     Assessment & Plan:  47 yo female with post operative cellulitis in right inguinal region moving into r labia. Decreased erythema over past 2 days once starting antibiotics. Continue 10 days of antibiotics.   Continue POPs for endometriosis. Will need to check cholesterol as Norethindrone can incrase LDL and decreased HDL.

## 2018-01-10 DIAGNOSIS — E785 Hyperlipidemia, unspecified: Secondary | ICD-10-CM | POA: Insufficient documentation

## 2018-01-17 ENCOUNTER — Encounter: Payer: Self-pay | Admitting: Obstetrics & Gynecology

## 2018-01-17 ENCOUNTER — Ambulatory Visit (INDEPENDENT_AMBULATORY_CARE_PROVIDER_SITE_OTHER): Payer: BLUE CROSS/BLUE SHIELD | Admitting: Obstetrics & Gynecology

## 2018-01-17 VITALS — BP 139/88 | HR 91 | Temp 97.3°F | Resp 16 | Ht 67.5 in | Wt 169.0 lb

## 2018-01-17 DIAGNOSIS — T148XXA Other injury of unspecified body region, initial encounter: Secondary | ICD-10-CM

## 2018-01-17 DIAGNOSIS — L089 Local infection of the skin and subcutaneous tissue, unspecified: Secondary | ICD-10-CM | POA: Diagnosis not present

## 2018-01-17 MED ORDER — SULFAMETHOXAZOLE-TRIMETHOPRIM 800-160 MG PO TABS: 2.0000 | ORAL_TABLET | Freq: Two times a day (BID) | ORAL | 0 refills | Status: DC

## 2018-01-20 NOTE — Progress Notes (Signed)
   Subjective:    Patient ID: Ashley Edwards, female    DOB: 1971-02-15, 47 y.o.   MRN: [id number]  HPI 47 yo female presents for evaluation of wound infection s/p diagnostic LSC and open inguinal excision of endometriosis of the abdominal wall.  Pt developed a wound infection 10-14 days post op.  Several days into antibiotic treatment the wound developed a fluctuant area and rupturedShe is 10 days into antibiotic treatment and doing much better.     Review of Systems  Constitutional: Negative.   Respiratory: Negative.   Cardiovascular: Negative.   Gastrointestinal: Negative.   Genitourinary:       Decreasing erythema or right inguinal area.  +drainage from incision (straw colored)  Neurological: Negative.        Objective:   Physical Exam  Constitutional: She is oriented to person, place, and time. She appears well-developed and well-nourished. No distress.  HENT:  Head: Normocephalic and atraumatic.  Eyes: Conjunctivae are normal.  Cardiovascular: Normal rate.  Pulmonary/Chest: Effort normal.  Abdominal: Soft. Bowel sounds are normal. She exhibits no distension and no mass. There is no tenderness. There is no rebound and no guarding.    Genitourinary:     Musculoskeletal: She exhibits no edema.  Neurological: She is alert and oriented to person, place, and time.  Skin: Skin is warm and dry.  Psychiatric: She has a normal mood and affect.  Vitals reviewed.  Vitals:   01/17/18 1121  BP: 139/88  Pulse: 91  Resp: 16  Temp: (!) 97.3 F (36.3 C)  TempSrc: Oral  Weight: 169 lb (76.7 kg)  Height: 5' 7.5" (1.715 m)       Assessment & Plan:  47 yo female with resolving wound infection and seroma  1.  Wound explored.  Fascia intact Culture sent. 2.  Called Serita Grammes, MD and reviewed clinical course.  Dr. Donne Hazel agreed with using 1/2 inch plain packing tape as wick and to keep incision slightly open to drain.  Husband taught to pack.   3.  Continue Bactrim DS  while wound continues to close.   4.  If not better in a week, will consider CT and referral to Dr. Donne Hazel.    25 minutes spent face to face with patient with >50% counseling.

## 2018-01-21 LAB — WOUND CULTURE
MICRO NUMBER:: 91324521
SPECIMEN QUALITY: ADEQUATE

## 2018-01-24 ENCOUNTER — Encounter: Payer: Self-pay | Admitting: Obstetrics & Gynecology

## 2018-01-24 ENCOUNTER — Ambulatory Visit (INDEPENDENT_AMBULATORY_CARE_PROVIDER_SITE_OTHER): Payer: BLUE CROSS/BLUE SHIELD | Admitting: Obstetrics & Gynecology

## 2018-01-24 VITALS — BP 140/89 | HR 83 | Ht 67.0 in | Wt 173.0 lb

## 2018-01-24 DIAGNOSIS — S301XXD Contusion of abdominal wall, subsequent encounter: Secondary | ICD-10-CM | POA: Diagnosis not present

## 2018-01-24 MED FILL — ALPRAZolam 0.5 MG TABS: 0.5 | 10 days supply | Qty: 30 | Fill #0

## 2018-01-24 NOTE — Progress Notes (Signed)
error 

## 2018-01-24 NOTE — Progress Notes (Signed)
   Subjective:    Patient ID: [name], female    DOB: 07-29-1970, 48 y.o.   MRN: [id number]  HPI  47 year old female presents for follow-up of wound abscess and seroma.  Patient has been packing the open incision for 1 week.  She has been on Bactrim twice a day.  She has much less serosanguineous fluid.  Patient denies fever redness or pain.  Doing well on progesterone only pills.  She does not have any pelvic pain.   Review of Systems  Constitutional: Negative.   Cardiovascular: Negative.   Gastrointestinal: Negative.   Genitourinary: Negative.        Objective:   Physical Exam  Constitutional: She is oriented to person, place, and time. She appears well-developed and well-nourished. No distress.  HENT:  Head: Normocephalic and atraumatic.  Eyes: Conjunctivae are normal.  Cardiovascular: Normal rate.  Pulmonary/Chest: Effort normal.  Abdominal: Soft. Bowel sounds are normal. She exhibits no distension and no mass. There is no tenderness. There is no rebound and no guarding.    Musculoskeletal: She exhibits no edema.  Neurological: She is alert and oriented to person, place, and time.  Skin: Skin is warm and dry.  Psychiatric: She has a normal mood and affect.  Vitals reviewed.  Vitals:   01/24/18 1317  BP: 140/89  Pulse: 83  Weight: 173 lb (78.5 kg)  Height: 5\' 7"  (1.702 m)      Assessment & Plan:  47 year old female presents for evaluation of wound.  Continue daily packing with quarter inch plain.  There will eventually be no room for packing and at this point she can discontinue.  Patient can stop antibiotics at this time.  If redness recurs patient is to call the office.  She will be in touch by phone if things worsen.  I expect that her wound will be healed in the next week to 10 days.

## 2018-01-31 MED FILL — NORETHINDRONE 0.35 MG TAB: 0.35 | 28 days supply | Qty: 28 | Fill #1

## 2018-02-01 ENCOUNTER — Telehealth: Payer: Self-pay

## 2018-02-01 DIAGNOSIS — B379 Candidiasis, unspecified: Secondary | ICD-10-CM

## 2018-02-01 MED ORDER — FLUCONAZOLE 150 MG PO TABS: ORAL_TABLET | ORAL | 0 refills | Status: DC

## 2018-02-01 MED FILL — FLUCONAZOLE 150 MG TABS: 150 | 3 days supply | Qty: 2 | Fill #0

## 2018-02-01 NOTE — Telephone Encounter (Signed)
Dr.Leggett called and asked me to send in 2 Diflucan for this pt. One now and one in three days with no refills. Rx sent.

## 2018-02-23 DIAGNOSIS — D2271 Melanocytic nevi of right lower limb, including hip: Secondary | ICD-10-CM | POA: Diagnosis not present

## 2018-02-23 DIAGNOSIS — D225 Melanocytic nevi of trunk: Secondary | ICD-10-CM | POA: Diagnosis not present

## 2018-02-23 DIAGNOSIS — D2262 Melanocytic nevi of left upper limb, including shoulder: Secondary | ICD-10-CM | POA: Diagnosis not present

## 2018-02-28 MED FILL — NORETHINDRONE 0.35 MG TAB: 0.35 | 28 days supply | Qty: 28 | Fill #2

## 2018-02-28 MED FILL — TELMISARTAN 20 MG TABS: 20 | 90 days supply | Qty: 90 | Fill #1

## 2018-03-02 MED FILL — CARTIA XT 120 MG CP24: 120 | 90 days supply | Qty: 90 | Fill #0

## 2018-03-30 MED FILL — NORLYDA 0.35 MG TABS: 0.35 | 28 days supply | Qty: 28 | Fill #3

## 2018-03-30 MED FILL — ATORVASTATIN 10 MG TABLET: 10 | 90 days supply | Qty: 90 | Fill #0

## 2018-04-11 DIAGNOSIS — E785 Hyperlipidemia, unspecified: Secondary | ICD-10-CM | POA: Diagnosis not present

## 2018-04-11 DIAGNOSIS — I10 Essential (primary) hypertension: Secondary | ICD-10-CM | POA: Diagnosis not present

## 2018-04-11 DIAGNOSIS — R82998 Other abnormal findings in urine: Secondary | ICD-10-CM | POA: Diagnosis not present

## 2018-04-11 DIAGNOSIS — E7849 Other hyperlipidemia: Secondary | ICD-10-CM | POA: Diagnosis not present

## 2018-04-11 DIAGNOSIS — Z0000 Encounter for general adult medical examination without abnormal findings: Secondary | ICD-10-CM | POA: Diagnosis not present

## 2018-04-18 DIAGNOSIS — R3915 Urgency of urination: Secondary | ICD-10-CM | POA: Diagnosis not present

## 2018-04-18 DIAGNOSIS — Z1331 Encounter for screening for depression: Secondary | ICD-10-CM | POA: Diagnosis not present

## 2018-04-18 DIAGNOSIS — Z0000 Encounter for general adult medical examination without abnormal findings: Secondary | ICD-10-CM | POA: Diagnosis not present

## 2018-04-18 DIAGNOSIS — N200 Calculus of kidney: Secondary | ICD-10-CM | POA: Diagnosis not present

## 2018-04-18 DIAGNOSIS — I10 Essential (primary) hypertension: Secondary | ICD-10-CM | POA: Diagnosis not present

## 2018-04-18 DIAGNOSIS — E7849 Other hyperlipidemia: Secondary | ICD-10-CM | POA: Diagnosis not present

## 2018-04-26 MED FILL — NORLYDA 0.35 MG TABS: 0.35 | 28 days supply | Qty: 28 | Fill #4

## 2018-05-04 MED FILL — ALPRAZolam 0.5 MG TABS: 0.5 | 10 days supply | Qty: 30 | Fill #0

## 2018-05-08 ENCOUNTER — Other Ambulatory Visit: Payer: Self-pay | Admitting: Obstetrics & Gynecology

## 2018-05-08 MED ORDER — LIDOCAINE-PRILOCAINE 2.5-2.5 % EX CREA: TOPICAL_CREAM | CUTANEOUS | 0 refills | Status: DC

## 2018-05-08 NOTE — Progress Notes (Signed)
Pt to have laser hair removal to decrease risk of folliculitis and subsequent infections.  EMLA prescribed for procedure.

## 2018-05-17 DIAGNOSIS — H04123 Dry eye syndrome of bilateral lacrimal glands: Secondary | ICD-10-CM | POA: Diagnosis not present

## 2018-05-17 DIAGNOSIS — H43813 Vitreous degeneration, bilateral: Secondary | ICD-10-CM | POA: Diagnosis not present

## 2018-05-17 DIAGNOSIS — H5213 Myopia, bilateral: Secondary | ICD-10-CM | POA: Diagnosis not present

## 2018-05-17 DIAGNOSIS — H25013 Cortical age-related cataract, bilateral: Secondary | ICD-10-CM | POA: Diagnosis not present

## 2018-05-23 MED FILL — TELMISARTAN 20 MG TABLET: 20 | 90 days supply | Qty: 90 | Fill #2

## 2018-05-23 MED FILL — CARTIA XT 120 MG CP24: 120 | 30 days supply | Qty: 30 | Fill #1 | Status: TO

## 2018-05-23 MED FILL — NORLYDA 0.35 MG TABS: 0.35 | 28 days supply | Qty: 28 | Fill #5 | Status: TO

## 2018-06-16 MED FILL — CARTIA XT 120 MG CP24: 120 | 60 days supply | Qty: 60 | Fill #0

## 2018-06-16 MED FILL — ATORVASTATIN 10 MG TABLET: 10 | 90 days supply | Qty: 90 | Fill #0

## 2018-06-16 MED FILL — NORETHINDRONE 0.35 MG TAB: 0.35 | 28 days supply | Qty: 28 | Fill #0

## 2018-06-17 MED FILL — ALPRAZolam 0.5 MG TABS: 0.5 | 10 days supply | Qty: 30 | Fill #0

## 2018-07-14 MED FILL — NORETHINDRONE 0.35 MG TAB: 0.35 | 28 days supply | Qty: 28 | Fill #1 | Status: TO

## 2018-07-23 MED FILL — ALPRAZolam 0.5 MG TABS: 0.5 | 10 days supply | Qty: 30 | Fill #0

## 2018-08-12 MED FILL — NORLYDA 0.35 MG TABS: 0.35 | 28 days supply | Qty: 28 | Fill #0

## 2018-08-30 MED FILL — TELMISARTAN 20 MG TABS: 20 | 90 days supply | Qty: 90 | Fill #3

## 2018-08-30 MED FILL — CARTIA XT 120 MG CP24: 120 | 90 days supply | Qty: 90 | Fill #0

## 2018-09-08 MED FILL — NORETHINDRONE 0.35 MG TAB: 0.35 | 28 days supply | Qty: 28 | Fill #0

## 2018-09-10 MED FILL — ATORVASTATIN 10 MG TABLET: 10 | 90 days supply | Qty: 90 | Fill #1

## 2018-09-22 MED FILL — ALPRAZolam 0.5 MG TABS: 0.5 | 10 days supply | Qty: 30 | Fill #0

## 2018-10-10 MED FILL — NORLYDA 0.35 MG TABS: 0.35 | 28 days supply | Qty: 28 | Fill #0

## 2018-11-02 MED FILL — NORLYDA 0.35 MG TABS: 0.35 | 28 days supply | Qty: 28 | Fill #1

## 2018-12-05 MED FILL — ATORVASTATIN 10 MG TABLET: 10 | 90 days supply | Qty: 90 | Fill #2

## 2018-12-05 MED FILL — TELMISARTAN 20 MG TABLET: 20 | 90 days supply | Qty: 90 | Fill #0

## 2018-12-05 MED FILL — NORETHINDRONE 0.35 MG TAB: 0.35 | 28 days supply | Qty: 28 | Fill #0

## 2018-12-05 MED FILL — CARTIA XT 120 MG CP24: 120 | 90 days supply | Qty: 90 | Fill #0

## 2018-12-06 MED FILL — ALPRAZolam 0.5 MG TABS: 0.5 | 10 days supply | Qty: 30 | Fill #0

## 2018-12-24 DIAGNOSIS — Z23 Encounter for immunization: Secondary | ICD-10-CM | POA: Diagnosis not present

## 2018-12-30 MED FILL — NORETHINDRONE 0.35 MG TAB: 0.35 | 28 days supply | Qty: 28 | Fill #0

## 2019-01-17 DIAGNOSIS — Z6826 Body mass index (BMI) 26.0-26.9, adult: Secondary | ICD-10-CM | POA: Diagnosis not present

## 2019-01-17 DIAGNOSIS — Z01419 Encounter for gynecological examination (general) (routine) without abnormal findings: Secondary | ICD-10-CM | POA: Diagnosis not present

## 2019-01-17 DIAGNOSIS — Z1231 Encounter for screening mammogram for malignant neoplasm of breast: Secondary | ICD-10-CM | POA: Diagnosis not present

## 2019-01-17 DIAGNOSIS — Z1151 Encounter for screening for human papillomavirus (HPV): Secondary | ICD-10-CM | POA: Diagnosis not present

## 2019-01-25 MED FILL — ESCITALOPRAM 10 MG TABLET: 10 | 90 days supply | Qty: 90 | Fill #0

## 2019-01-25 MED FILL — NORETHINDRONE 0.35 MG TAB: 0.35 | 28 days supply | Qty: 28 | Fill #0

## 2019-01-26 MED FILL — ALPRAZolam 0.5 MG TABS: 0.5 | 30 days supply | Qty: 30 | Fill #0

## 2019-03-01 MED FILL — DILTIAZEM HCL ER COATED BEA: 120 | 90 days supply | Qty: 90 | Fill #1

## 2019-03-01 MED FILL — NORETHINDRONE 0.35 MG TAB: 0.35 | 28 days supply | Qty: 28 | Fill #1

## 2019-03-31 MED FILL — NORETHINDRONE 0.35 MG TAB: 0.35 | 28 days supply | Qty: 28 | Fill #2

## 2019-04-11 DIAGNOSIS — M25521 Pain in right elbow: Secondary | ICD-10-CM | POA: Diagnosis not present

## 2019-04-12 MED FILL — ESCITALOPRAM 10 MG TABLET: 10 | 90 days supply | Qty: 90 | Fill #1

## 2019-04-14 MED FILL — ATORVASTATIN 10 MG TABLET: 10 | 90 days supply | Qty: 90 | Fill #0

## 2019-04-19 DIAGNOSIS — M25521 Pain in right elbow: Secondary | ICD-10-CM | POA: Diagnosis not present

## 2019-04-27 DIAGNOSIS — M25521 Pain in right elbow: Secondary | ICD-10-CM | POA: Diagnosis not present

## 2019-04-28 MED FILL — NORETHINDRONE 0.35 MG TAB: 0.35 | 28 days supply | Qty: 28 | Fill #3

## 2019-05-04 DIAGNOSIS — M25521 Pain in right elbow: Secondary | ICD-10-CM | POA: Diagnosis not present

## 2019-05-11 DIAGNOSIS — M25521 Pain in right elbow: Secondary | ICD-10-CM | POA: Diagnosis not present

## 2019-05-11 MED FILL — ALPRAZolam 0.5 MG TABS: 0.5 | 10 days supply | Qty: 30 | Fill #0

## 2019-05-23 DIAGNOSIS — R7301 Impaired fasting glucose: Secondary | ICD-10-CM | POA: Diagnosis not present

## 2019-05-23 DIAGNOSIS — E7849 Other hyperlipidemia: Secondary | ICD-10-CM | POA: Diagnosis not present

## 2019-05-23 DIAGNOSIS — E785 Hyperlipidemia, unspecified: Secondary | ICD-10-CM | POA: Diagnosis not present

## 2019-05-23 DIAGNOSIS — Z0000 Encounter for general adult medical examination without abnormal findings: Secondary | ICD-10-CM | POA: Diagnosis not present

## 2019-05-29 DIAGNOSIS — R7301 Impaired fasting glucose: Secondary | ICD-10-CM | POA: Diagnosis not present

## 2019-05-29 DIAGNOSIS — N200 Calculus of kidney: Secondary | ICD-10-CM | POA: Diagnosis not present

## 2019-05-29 DIAGNOSIS — Z1331 Encounter for screening for depression: Secondary | ICD-10-CM | POA: Diagnosis not present

## 2019-05-29 DIAGNOSIS — Z800 Family history of malignant neoplasm of digestive organs: Secondary | ICD-10-CM | POA: Diagnosis not present

## 2019-05-29 DIAGNOSIS — Z0000 Encounter for general adult medical examination without abnormal findings: Secondary | ICD-10-CM | POA: Diagnosis not present

## 2019-05-29 DIAGNOSIS — N809 Endometriosis, unspecified: Secondary | ICD-10-CM | POA: Diagnosis not present

## 2019-05-29 MED FILL — TELMISARTAN 20 MG TABLET: 20 | 90 days supply | Qty: 90 | Fill #0

## 2019-05-29 MED FILL — NORETHINDRONE 0.35 MG TAB: 0.35 | 28 days supply | Qty: 28 | Fill #4

## 2019-05-29 MED FILL — CARTIA XT 120 MG CP24: 120 | 90 days supply | Qty: 90 | Fill #0

## 2019-06-22 DIAGNOSIS — N201 Calculus of ureter: Secondary | ICD-10-CM | POA: Diagnosis not present

## 2019-06-23 ENCOUNTER — Other Ambulatory Visit: Payer: Self-pay | Admitting: Urology

## 2019-06-23 ENCOUNTER — Other Ambulatory Visit (HOSPITAL_COMMUNITY)
Admission: RE | Admit: 2019-06-23 | Discharge: 2019-06-23 | Disposition: A | Discharge status: Home / Self Care | Payer: BLUE CROSS/BLUE SHIELD | Source: Ambulatory Visit | Attending: Urology | Admitting: Urology

## 2019-06-23 DIAGNOSIS — Z01812 Encounter for preprocedural laboratory examination: Secondary | ICD-10-CM | POA: Diagnosis not present

## 2019-06-23 DIAGNOSIS — Z20822 Contact with and (suspected) exposure to covid-19: Secondary | ICD-10-CM | POA: Diagnosis not present

## 2019-06-23 LAB — SARS CORONAVIRUS 2 (TAT 6-24 HRS): SARS Coronavirus 2: NEGATIVE

## 2019-06-23 NOTE — Progress Notes (Signed)
Patient to arrive at 0600 on 06/26/19. NPO after midnight. No medications morning of surgery. Patient takes BP medication at night. History  and medication list reviewed.  Instructed to stop Ibuprofen 48 hours prior to procedure. Pre-op instructions reviewed.

## 2019-06-23 NOTE — H&P (Addendum)
Office Visit Report 06/22/2019  Ashley Edwards        MRN: [id number] PRIMARY CARE:  Jeannette How. Joylene Draft, MD  REFERRING:    PROVIDER:  Kathie Rhodes, M.D. DOB: 10/28/1970, 49 year old Female TREATING:  Daine Gravel, NP SSN: [id number] LOCATION:  [facility] [phone]  CC: I have kidney stones. HPI: Ashley Edwards is a 49 year-old female established patient who is here for renal calculi.  06/22/19: Ashley Edwards has a well known history of nephrolithiasis. She has passed a few on her own and required ESWL in the past. She has never required ureteroscopy or STENT placement. She presents today with left sided groin discomfort and frequency of urination. She denies gross hematuria, fevers, nausea and dysuria. Her pain is quite tolerable and she has not required any medications. She began taking flomax 2 days ago. She has not seen a stone pass in her urine since her symptoms began.    The problem is on the left side. She first stated noticing pain on 06/20/2019. This is not her first kidney stone. She has had more than 5 stones prior to getting this one. She is currently having groin pain. She denies having flank pain, back pain, nausea, vomiting, fever, and chills. She has not caught a stone in her urine strainer since her symptoms began.   She has had eswl for treatment of her stones in the past.    ALLERGIES: hydromorphone   MEDICATIONS: Lipitor 10 mg tablet  Tamsulosin Hcl 0.4 mg capsule 1 capsule PO Q PM  Diltiazem 24Hr Er  Micardis    GU PSH: ESWL - 09/02/2017     NON-GU PSH: Neck Surgery Remove Ovary(s)         GU PMH: Renal calculus (Stable), Her renal calculi revealed no evidence of metabolic stone activity. I am going to have her return in 6 months for a KUB and then if all remains stable can likely extend this to yearly or possibly p.r.n.Marland Kitchen - 11/30/2017, - 09/20/2017, - 2019 Renal cyst, Right - 2019      PMH Notes: Calculus disease: She has a history of calculus  disease having undergone lithotripsy for a left distal ureteral stone on 09/02/17. She has known bilateral renal calculi.  Stone risk 2/19: Her serum studies revealed no abnormality specifically no elevation of her serum calcium or PTH. Her 24-hour urine revealed a volume of 1.56 L with elevated saturation of sodium urate and calcium phosphate.     NON-GU PMH: Other intra-abdominal and pelvic swelling, mass and lump, Right, It seems that her right groin mass appears to be intimately associated with her menstrual cycle. I called over and asked her gynecologist, Dr. Ronita Hipps, to take a look at her CT scan. She has an appointment with him in August. It sounds as if this has been going on for quite some time and likely is a benign finding. - 2019 GERD Hypercholesterolemia Hypertension    FAMILY HISTORY: Blood In Urine - Brother, Father CABG - Father Hypertension - Father nephrolithiasis - Brother, Father   SOCIAL HISTORY: Marital Status: Married Preferred Language: English; Ethnicity: Not Hispanic Or Latino; Race: White Current Smoking Status: Patient has never smoked.  <DIV'  Tobacco Use Assessment Completed:  Used Tobacco in last 30 days?      REVIEW OF SYSTEMS:     GU Review Female:  Patient reports frequent urination and hard to postpone urination. Patient denies burning /pain with urination, get up at night to  urinate, leakage of urine, stream starts and stops, trouble starting your stream, have to strain to urinate, and being pregnant.    Gastrointestinal (Upper):  Patient denies nausea, vomiting, and indigestion/ heartburn.    Gastrointestinal (Lower):  Patient denies diarrhea and constipation.    Constitutional:  Patient denies fever, night sweats, weight loss, and fatigue.    Skin:  Patient denies skin rash/ lesion and itching.    Eyes:  Patient denies double vision and blurred vision.    Ears/ Nose/ Throat:  Patient denies sore throat and sinus problems.    Hematologic/Lymphatic:   Patient denies swollen glands and easy bruising.    Cardiovascular:  Patient denies leg swelling and chest pains.    Respiratory:  Patient denies cough and shortness of breath.    Endocrine:  Patient denies excessive thirst.    Musculoskeletal:  Patient denies back pain and joint pain.    Neurological:  Patient denies headaches and dizziness.    Psychologic:  Patient denies depression and anxiety.    VITAL SIGNS:       06/22/2019 11:01 AM     Weight 167 lb / 75.75 kg     Height 67 in / 170.18 cm     BP 158/94 mmHg     Pulse 76 /min     Temperature 98.0 F / 36.6 C     BMI 26.2 kg/m     MULTI-SYSTEM PHYSICAL EXAMINATION:      Constitutional: Well-nourished. No physical deformities. Normally developed. Good grooming.     Neck: Neck symmetrical, not swollen. Normal tracheal position.     Respiratory: No labored breathing, no use of accessory muscles.      Cardiovascular: Normal temperature, normal extremity pulses, no swelling, no varicosities.     Lymphatic: No enlargement of neck, axillae, groin.     Skin: No paleness, no jaundice, no cyanosis. No lesion, no ulcer, no rash.     Neurologic / Psychiatric: Oriented to time, oriented to place, oriented to person. No depression, no anxiety, no agitation.     Gastrointestinal: No mass, no tenderness, no rigidity, non obese abdomen.     Musculoskeletal: Normal gait and station of head and neck. NO CVAT            PAST DATA REVIEWED:  Source Of History:  Patient, Medical Record Summary Records Review:  Previous Doctor Records, Previous Patient Records Urine Test Review:  Urinalysis, Urine Culture X-Ray Review: KUB: Reviewed Films. Discussed With Patient.     06/22/19 Urinalysis Urine Appearance Clear  Urine Color Yellow  Urine Glucose Neg mg/dL Urine Bilirubin Neg mg/dL Urine Ketones Neg mg/dL Urine Specific Gravity 1.015  Urine Blood 1+ ery/uL Urine pH 5.5  Urine Protein Neg mg/dL Urine Urobilinogen 0.2 mg/dL Urine  Nitrites Neg  Urine Leukocyte Esterase Neg leu/uL Urine WBC/hpf NS (Not Seen)  Urine RBC/hpf 3 - 10/hpf  Urine Epithelial Cells 0 - 5/hpf  Urine Bacteria NS (Not Seen)  Urine Mucous Not Present  Urine Yeast NS (Not Seen)  Urine Trichomonas Not Present  Urine Cystals NS (Not Seen)  Urine Casts NS (Not Seen)  Urine Sperm Not Present   PROCEDURES:   KUB - 36144 A single view of the abdomen is obtained. There is a normal bowel gas pattern. There are opacities noted within the renal shadows bilaterally. There is a approximate 8-54m opacity within the left distal ureter near the expected anatomical position of the UVJ. There are no opacities noted along the expected anatomical  path of the right ureter.    Patient confirmed No Neulasta OnPro Device.     Urinalysis w/Scope  Dipstick Dipstick Cont'd Micro Color: Yellow Bilirubin: Neg mg/dL WBC/hpf: NS (Not Seen) Appearance: Clear Ketones: Neg mg/dL RBC/hpf: 3 - 10/hpf Specific Gravity: 1.015 Blood: 1+ ery/uL Bacteria: NS (Not Seen) pH: 5.5 Protein: Neg mg/dL Cystals: NS (Not Seen) Glucose: Neg mg/dL Urobilinogen: 0.2 mg/dL Casts: NS (Not Seen)  Nitrites: Neg Trichomonas: Not Present  Leukocyte Esterase: Neg leu/uL Mucous: Not Present   Epithelial Cells: 0 - 5/hpf   Yeast: NS (Not Seen)   Sperm: Not Present   ASSESSMENT:    ICD-10 Details 1 GU:  Ureteral calculus - N20.1 Left, Acute, Uncomplicated  PLAN:  Medications  Refill Meds: Tamsulosin Hcl 0.4 mg capsule 1 capsule PO Q PM #30 0 Refill(s)   New Meds: Hydrocodone-Acetaminophen 5 mg-325 mg tablet 1 1/2 tablet PO Q 6 H #20 0 Refill(s)  Ondansetron Odt 4 mg tablet,disintegrating 1 tablet PO Q 4 H #30 0 Refill(s)   Orders  Labs CULTURE, URINE X-Rays: KUB Schedule  Return Visit/Planned Activity: Next Available Appointment - Schedule Surgery Document  Letter(s):  Created for Patient: Clinical Summary  Notes:  Urinalysis with RBCs, but not concerning for infection. A  precautionary culture will be sent today. KUB shows an opacity wiithin the expected path of left distal ureter. She is having very mild symptoms. I do not think this stone is passable due to its size of about 39m. I discussed options with her including MET, ESWL and URS. I aslo expressed my conern that she may not be able to pass a stone of this size. She decided to proceed with ESWL and trial MET while waiting on her procedure. Prescription for tamsulosin, hydrocodone and zofran sent to her pharmacy. Posting sheet for ESWL completed. I will discuss further with her urologist and proceed as indicated. She understand to call the office or proceed to the ED if she develops worsening pain not controlled with medications, uncontrolled vomiting, fevers, or chills.   * Signed by JDaine Gravel NP on 06/22/19 at 12:41 PM (EDT)*   Add: I reviewed the chart and images from recent KUB and 2019 CT and KUBs. Appears the LLP stone dropped. UA was clear.   The information contained in this medical record document is considered private and confidential patient information. This information can only be used for the medical diagnosis and/or medical services that are being provided by the patient's selected caregivers. This information can only be distributed outside of the patient's care if the patient agrees and signs waivers of authorization for this information to be sent to an outside source or route.

## 2019-06-26 ENCOUNTER — Encounter (HOSPITAL_BASED_OUTPATIENT_CLINIC_OR_DEPARTMENT_OTHER)
Admission: RE | Disposition: A | Discharge status: Home / Self Care | Payer: Self-pay | Source: Home / Self Care | Attending: Urology

## 2019-06-26 ENCOUNTER — Ambulatory Visit (HOSPITAL_COMMUNITY): Payer: BLUE CROSS/BLUE SHIELD

## 2019-06-26 ENCOUNTER — Ambulatory Visit (HOSPITAL_BASED_OUTPATIENT_CLINIC_OR_DEPARTMENT_OTHER)
Admission: RE | Admit: 2019-06-26 | Discharge: 2019-06-26 | Disposition: A | Discharge status: Home / Self Care | Payer: BLUE CROSS/BLUE SHIELD | Attending: Urology | Admitting: Urology

## 2019-06-26 ENCOUNTER — Encounter (HOSPITAL_BASED_OUTPATIENT_CLINIC_OR_DEPARTMENT_OTHER): Payer: Self-pay | Admitting: Urology

## 2019-06-26 ENCOUNTER — Other Ambulatory Visit (HOSPITAL_COMMUNITY): Payer: BLUE CROSS/BLUE SHIELD

## 2019-06-26 ENCOUNTER — Other Ambulatory Visit: Payer: Self-pay

## 2019-06-26 DIAGNOSIS — Z885 Allergy status to narcotic agent status: Secondary | ICD-10-CM | POA: Insufficient documentation

## 2019-06-26 DIAGNOSIS — N201 Calculus of ureter: Secondary | ICD-10-CM | POA: Diagnosis not present

## 2019-06-26 DIAGNOSIS — Z87442 Personal history of urinary calculi: Secondary | ICD-10-CM | POA: Diagnosis not present

## 2019-06-26 DIAGNOSIS — Z79899 Other long term (current) drug therapy: Secondary | ICD-10-CM | POA: Insufficient documentation

## 2019-06-26 DIAGNOSIS — N202 Calculus of kidney with calculus of ureter: Secondary | ICD-10-CM | POA: Diagnosis not present

## 2019-06-26 DIAGNOSIS — I10 Essential (primary) hypertension: Secondary | ICD-10-CM | POA: Insufficient documentation

## 2019-06-26 DIAGNOSIS — N200 Calculus of kidney: Secondary | ICD-10-CM | POA: Diagnosis not present

## 2019-06-26 DIAGNOSIS — E7800 Pure hypercholesterolemia, unspecified: Secondary | ICD-10-CM | POA: Insufficient documentation

## 2019-06-26 DIAGNOSIS — Z01818 Encounter for other preprocedural examination: Secondary | ICD-10-CM | POA: Diagnosis not present

## 2019-06-26 HISTORY — PX: EXTRACORPOREAL SHOCK WAVE LITHOTRIPSY: SHX1557

## 2019-06-26 HISTORY — DX: Other specified postprocedural states: Z98.890

## 2019-06-26 HISTORY — DX: Nausea with vomiting, unspecified: R11.2

## 2019-06-26 LAB — POCT PREGNANCY, URINE: Preg Test, Ur: NEGATIVE

## 2019-06-26 SURGERY — LITHOTRIPSY, ESWL
Anesthesia: LOCAL | Laterality: Left

## 2019-06-26 MED ORDER — DIPHENHYDRAMINE HCL 25 MG PO CAPS
25.0000 mg | ORAL_CAPSULE | ORAL | Status: AC
  Administered 2019-06-26: 07:00:00 25 mg via ORAL
  Filled 2019-06-26: qty 1

## 2019-06-26 MED ORDER — DIAZEPAM 5 MG PO TABS
10.0000 mg | ORAL_TABLET | ORAL | Status: AC
  Administered 2019-06-26: 10 mg via ORAL
  Filled 2019-06-26: qty 2

## 2019-06-26 MED ORDER — SODIUM CHLORIDE 0.9 % IV SOLN
INTRAVENOUS | Status: DC
  Filled 2019-06-26: qty 1000

## 2019-06-26 MED ORDER — CIPROFLOXACIN HCL 500 MG PO TABS
ORAL_TABLET | ORAL | Status: AC
  Filled 2019-06-26: qty 1

## 2019-06-26 MED ORDER — DIAZEPAM 5 MG PO TABS
ORAL_TABLET | ORAL | Status: AC
  Filled 2019-06-26: qty 2

## 2019-06-26 MED ORDER — CIPROFLOXACIN HCL 500 MG PO TABS
500.0000 mg | ORAL_TABLET | ORAL | Status: AC
  Administered 2019-06-26: 500 mg via ORAL
  Filled 2019-06-26: qty 1

## 2019-06-26 MED ORDER — DIPHENHYDRAMINE HCL 25 MG PO CAPS
ORAL_CAPSULE | ORAL | Status: AC
  Filled 2019-06-26: qty 1

## 2019-06-26 MED ORDER — TAMSULOSIN HCL 0.4 MG PO CAPS: 0.4000 mg | ORAL_CAPSULE | Freq: Every day | ORAL | 0 refills | Status: DC

## 2019-06-26 NOTE — Discharge Instructions (Signed)
Post Anesthesia Home Care Instructions  Activity: Get plenty of rest for the remainder of the day. A responsible adult should stay with you for 24 hours following the procedure.  For the next 24 hours, DO NOT: -Drive a car -Operate machinery -Drink alcoholic beverages -Take any medication unless instructed by your physician -Make any legal decisions or sign important papers.  Meals: Start with liquid foods such as gelatin or soup. Progress to regular foods as tolerated. Avoid greasy, spicy, heavy foods. If nausea and/or vomiting occur, drink only clear liquids until the nausea and/or vomiting subsides. Call your physician if vomiting continues.  Special Instructions/Symptoms: Your throat may feel dry or sore from the anesthesia or the breathing tube placed in your throat during surgery. If this causes discomfort, gargle with warm salt water. The discomfort should disappear within 24 hours.  If you had a scopolamine patch placed behind your ear for the management of post- operative nausea and/or vomiting:  1. The medication in the patch is effective for 72 hours, after which it should be removed.  Wrap patch in a tissue and discard in the trash. Wash hands thoroughly with soap and water. 2. You may remove the patch earlier than 72 hours if you experience unpleasant side effects which may include dry mouth, dizziness or visual disturbances. 3. Avoid touching the patch. Wash your hands with soap and water after contact with the patch.   Lithotripsy, Care After This sheet gives you information about how to care for yourself after your procedure. Your health care provider may also give you more specific instructions. If you have problems or questions, contact your health care provider. What can I expect after the procedure? After the procedure, it is common to have:  Some blood in your urine. This should only last for a few days.  Soreness in your back, sides, or upper abdomen for a few  days.  Blotches or bruises on your back where the pressure wave entered the skin.  Pain, discomfort, or nausea when pieces (fragments) of the kidney stone move through the tube that carries urine from the kidney to the bladder (ureter). Stone fragments may pass soon after the procedure, but they may continue to pass for up to 4-8 weeks. ? If you have severe pain or nausea, contact your health care provider. This may be caused by a large stone that was not broken up, and this may mean that you need more treatment.  Some pain or discomfort during urination.  Some pain or discomfort in the lower abdomen or (in men) at the base of the penis. Follow these instructions at home: Medicines  Take over-the-counter and prescription medicines only as told by your health care provider.  If you were prescribed an antibiotic medicine, take it as told by your health care provider. Do not stop taking the antibiotic even if you start to feel better.  Do not drive for 24 hours if you were given a medicine to help you relax (sedative).  Do not drive or use heavy machinery while taking prescription pain medicine. Eating and drinking      Drink enough water and fluids to keep your urine clear or pale yellow. This helps any remaining pieces of the stone to pass. It can also help prevent new stones from forming.  Eat plenty of fresh fruits and vegetables.  Follow instructions from your health care provider about eating and drinking restrictions. You may be instructed: ? To reduce how much salt (sodium) you eat   or drink. Check ingredients and nutrition facts on packaged foods and beverages. ? To reduce how much meat you eat.  Eat the recommended amount of calcium for your age and gender. Ask your health care provider how much calcium you should have. General instructions  Get plenty of rest.  Most people can resume normal activities 1-2 days after the procedure. Ask your health care provider what  activities are safe for you.  Your health care provider may direct you to lie in a certain position (postural drainage) and tap firmly (percuss) over your kidney area to help stone fragments pass. Follow instructions as told by your health care provider.  If directed, strain all urine through the strainer that was provided by your health care provider. ? Keep all fragments for your health care provider to see. Any stones that are found may be sent to a medical lab for examination. The stone may be as small as a grain of salt.  Keep all follow-up visits as told by your health care provider. This is important. Contact a health care provider if:  You have pain that is severe or does not get better with medicine.  You have nausea that is severe or does not go away.  You have blood in your urine longer than your health care provider told you to expect.  You have more blood in your urine.  You have pain during urination that does not go away.  You urinate more frequently than usual and this does not go away.  You develop a rash or any other possible signs of an allergic reaction. Get help right away if:  You have severe pain in your back, sides, or upper abdomen.  You have severe pain while urinating.  Your urine is very dark red.  You have blood in your stool (feces).  You cannot pass any urine at all.  You feel a strong urge to urinate after emptying your bladder.  You have a fever or chills.  You develop shortness of breath, difficulty breathing, or chest pain.  You have severe nausea that leads to persistent vomiting.  You faint. Summary  After this procedure, it is common to have some pain, discomfort, or nausea when pieces (fragments) of the kidney stone move through the tube that carries urine from the kidney to the bladder (ureter). If this pain or nausea is severe, however, you should contact your health care provider.  Most people can resume normal activities 1-2  days after the procedure. Ask your health care provider what activities are safe for you.  Drink enough water and fluids to keep your urine clear or pale yellow. This helps any remaining pieces of the stone to pass, and it can help prevent new stones from forming.  If directed, strain your urine and keep all fragments for your health care provider to see. Fragments or stones may be as small as a grain of salt.  Get help right away if you have severe pain in your back, sides, or upper abdomen or have severe pain while urinating. This information is not intended to replace advice given to you by your health care provider. Make sure you discuss any questions you have with your health care provider. Document Revised: 06/13/2018 Document Reviewed: 01/22/2016 Elsevier Patient Education  2020 [employer]  

## 2019-06-26 NOTE — Op Note (Signed)
Left distal stone, 9 mm   LEFT ESWL   Findings: Pt tolerated procedure very well. Energy up to 6 and rate slowed to 60 but stone was slow to fade and break up. She may need a staged procedure if stone fails to pass. Discussed with Whit.

## 2019-06-26 NOTE — Interval H&P Note (Signed)
History and Physical Interval Note:  06/26/2019 7:47 AM  Ashley Edwards  has presented today for surgery, with the diagnosis of LEFT DISTAL URETERAL STONE.  The various methods of treatment have been discussed with the patient and family. After consideration of risks, benefits and other options for treatment, the patient has consented to  Procedure(s): EXTRACORPOREAL SHOCK WAVE LITHOTRIPSY (ESWL) (Left) as a surgical intervention.  She has not passed the stone. Continues to have LLQ discomfort. No dysuria or fever. The patient's history has been reviewed, patient examined, no change in status, stable for surgery.  I have reviewed the patient's chart and labs.  Questions were answered to the patient's satisfaction.     Festus Aloe

## 2019-06-27 MED FILL — NORETHINDRONE 0.35 MG TAB: 0.35 | 28 days supply | Qty: 28 | Fill #5

## 2019-06-30 MED FILL — ALPRAZolam 0.5 MG TABS: 0.5 | 10 days supply | Qty: 30 | Fill #0

## 2019-07-10 DIAGNOSIS — N200 Calculus of kidney: Secondary | ICD-10-CM | POA: Diagnosis not present

## 2019-08-07 DIAGNOSIS — N202 Calculus of kidney with calculus of ureter: Secondary | ICD-10-CM | POA: Diagnosis not present

## 2019-08-09 MED FILL — ALPRAZolam 0.5 MG TABS: 0.5 | 10 days supply | Qty: 30 | Fill #0

## 2019-08-09 MED FILL — TELMISARTAN 20 MG TABS: 20 | 90 days supply | Qty: 90 | Fill #1

## 2019-08-10 MED FILL — DILTIAZEM HCL ER COATED BEA: 120 | 90 days supply | Qty: 90 | Fill #1

## 2019-08-15 MED FILL — NORETHINDRONE 0.35 MG TAB: 0.35 | 28 days supply | Qty: 28 | Fill #7

## 2019-08-28 DIAGNOSIS — N390 Urinary tract infection, site not specified: Secondary | ICD-10-CM | POA: Diagnosis not present

## 2019-08-28 DIAGNOSIS — N201 Calculus of ureter: Secondary | ICD-10-CM | POA: Diagnosis not present

## 2019-09-01 DIAGNOSIS — N201 Calculus of ureter: Secondary | ICD-10-CM | POA: Diagnosis not present

## 2019-09-12 MED FILL — NORETHINDRONE 0.35 MG TAB: 0.35 | 28 days supply | Qty: 28 | Fill #8

## 2019-10-17 MED FILL — NORETHINDRONE 0.35 MG TAB: 0.35 | 28 days supply | Qty: 28 | Fill #9

## 2019-10-24 MED FILL — ATORVASTATIN CALCIUM 10 MG: 10 | 90 days supply | Qty: 90 | Fill #2

## 2019-11-13 DIAGNOSIS — M25521 Pain in right elbow: Secondary | ICD-10-CM | POA: Diagnosis not present

## 2019-11-13 DIAGNOSIS — M25522 Pain in left elbow: Secondary | ICD-10-CM | POA: Diagnosis not present

## 2019-11-13 MED FILL — DILTIAZEM HCL ER COATED BEA: 120 | 90 days supply | Qty: 90 | Fill #2

## 2019-11-13 MED FILL — NORETHINDRONE 0.35 MG TAB: 0.35 | 28 days supply | Qty: 28 | Fill #10

## 2019-11-13 MED FILL — TELMISARTAN 20 MG TABS: 20 | 90 days supply | Qty: 90 | Fill #2

## 2019-11-22 DIAGNOSIS — M25521 Pain in right elbow: Secondary | ICD-10-CM | POA: Diagnosis not present

## 2019-11-22 DIAGNOSIS — M25522 Pain in left elbow: Secondary | ICD-10-CM | POA: Diagnosis not present

## 2019-11-23 ENCOUNTER — Other Ambulatory Visit (HOSPITAL_COMMUNITY): Payer: Self-pay | Admitting: Internal Medicine

## 2019-11-23 MED FILL — ESCITALOPRAM 10 MG TABLET: 10 | 90 days supply | Qty: 90 | Fill #0

## 2019-11-28 DIAGNOSIS — Z1211 Encounter for screening for malignant neoplasm of colon: Secondary | ICD-10-CM | POA: Diagnosis not present

## 2019-11-29 DIAGNOSIS — M25521 Pain in right elbow: Secondary | ICD-10-CM | POA: Diagnosis not present

## 2019-11-29 DIAGNOSIS — M25522 Pain in left elbow: Secondary | ICD-10-CM | POA: Diagnosis not present

## 2019-12-13 DIAGNOSIS — H43813 Vitreous degeneration, bilateral: Secondary | ICD-10-CM | POA: Diagnosis not present

## 2019-12-13 DIAGNOSIS — H5213 Myopia, bilateral: Secondary | ICD-10-CM | POA: Diagnosis not present

## 2019-12-13 DIAGNOSIS — H04123 Dry eye syndrome of bilateral lacrimal glands: Secondary | ICD-10-CM | POA: Diagnosis not present

## 2019-12-13 MED FILL — NORETHINDRONE 0.35 MG TAB: 0.35 | 28 days supply | Qty: 28 | Fill #11

## 2019-12-15 ENCOUNTER — Telehealth: Payer: Self-pay | Admitting: *Deleted

## 2019-12-15 NOTE — Telephone Encounter (Signed)
===  View-only below this line=== ----- Message ----- From: Jerene Bears, MD Sent: 12/15/2019   7:56 AM EDT To: Oda Kilts, CMA  Yes with previsit ----- Message ----- From: Oda Kilts, CMA Sent: 12/14/2019   3:29 PM EDT To: Jerene Bears, MD  She will need a Pre-visit though   ----- Message ----- From: Jerene Bears, MD Sent: 12/14/2019   2:56 PM EDT To: Oda Kilts, CMA  Jamine Highfill See below This is a friend of mine who needs EGD/colon with Dr. Silverio Decamp. Can you please contact the patient and arrange.  She does not need office visit 1st Thanks JMP  ----- Message ----- From: Mauri Pole, MD Sent: 12/14/2019   1:28 PM EDT To: Jerene Bears, MD  Hannah Beat,  I am fine with doing direct EGD and Colonoscopy, based on findings I can discuss with her if she needs to come in for follow up after the procedures Thank you Margarette Asal ----- Message ----- From: Jerene Bears, MD Sent: 12/14/2019  11:35 AM EDT To: Mauri Pole, MD  Hi Veena, The attached patient is one of our friends who is in need of screening colonoscopy.  I have discussed this with her in person and I wanted to see if you would be willing to do it.  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 She also reports that she has intermittent solid food dysphagia when she takes big bites or eats too quickly.  She reports she has a family history of esophageal stricture/ring and feels that she may need an upper endoscopy and possible dilation. No severe GERD symptoms, not progressive, no weight loss.  Very intermittent symptom.  I told her I would ask you if you would be willing to do upper endoscopy directly, but you may want to see her in person first.  Let me know what you want, and I can have Dottie arrange it.  Thank you Ulice Dash   Dx codes Dysphagia Screening colon    Called patient to schedule EGD/Colon She wants an appointment in December, schedule is not out yet... We will call her back to  schedule

## 2020-01-03 ENCOUNTER — Other Ambulatory Visit (HOSPITAL_COMMUNITY): Payer: Self-pay | Admitting: Obstetrics and Gynecology

## 2020-01-03 MED FILL — NORETHINDRONE 0.35 MG TAB: 0.35 | 28 days supply | Qty: 28 | Fill #0

## 2020-01-04 ENCOUNTER — Other Ambulatory Visit (HOSPITAL_COMMUNITY): Payer: Self-pay | Admitting: Obstetrics and Gynecology

## 2020-01-04 MED FILL — ALPRAZolam 0.5 MG TABS: 0.5 | 10 days supply | Qty: 30 | Fill #0

## 2020-01-05 ENCOUNTER — Encounter: Payer: Self-pay | Admitting: Gastroenterology

## 2020-01-09 NOTE — Telephone Encounter (Signed)
Patient is scheduled for 12/8 at 1:30pm also has preop appointment scheduled

## 2020-01-11 ENCOUNTER — Encounter: Payer: Self-pay | Admitting: Obstetrics & Gynecology

## 2020-01-11 ENCOUNTER — Telehealth (INDEPENDENT_AMBULATORY_CARE_PROVIDER_SITE_OTHER): Payer: BLUE CROSS/BLUE SHIELD | Admitting: Obstetrics & Gynecology

## 2020-01-11 DIAGNOSIS — N809 Endometriosis, unspecified: Secondary | ICD-10-CM

## 2020-01-11 DIAGNOSIS — Z9889 Other specified postprocedural states: Secondary | ICD-10-CM | POA: Diagnosis not present

## 2020-01-11 DIAGNOSIS — R1031 Right lower quadrant pain: Secondary | ICD-10-CM | POA: Diagnosis not present

## 2020-01-11 NOTE — Progress Notes (Addendum)
   TELEHEALTH GYNECOLOGY VISIT ENCOUNTER NOTE  I connected with [name] on 01/11/20 at  2:00 PM EDT by telephone at home and verified that I am speaking with the correct person using two identifiers.  Pt was located at home and I was located at the [facility].   I discussed the limitations, risks, security and privacy concerns of performing an evaluation and management service by telephone and the availability of in person appointments. I also discussed with the patient that there may be a patient responsible charge related to this service. The patient expressed understanding and agreed to proceed.   History:  Ashley Edwards is a 49 y.o. [phone] female being evaluated today for right lower quadrant pain over her incision from endometrioma removal. She denies any abnormal vaginal discharge, bleeding, pelvic pain or other concerns.  Pt has not had a menses for a year (on norethindrone for endometriosis suppression).     Past Medical History:  Diagnosis Date  . History of kidney stones   . Hyperlipemia   . Hypertension   . Microalbuminuria   . PONV (postoperative nausea and vomiting)    Past Surgical History:  Procedure Laterality Date  . BREAST REDUCTION SURGERY  2016  . CERVICAL DISCECTOMY    . DIAGNOSTIC LAPAROSCOPY Left    left ovary removed  . DILATION AND CURETTAGE OF UTERUS    . EXTRACORPOREAL SHOCK WAVE LITHOTRIPSY Left 09/02/2017   Procedure: LEFT EXTRACORPOREAL SHOCK WAVE LITHOTRIPSY (ESWL);  Surgeon: Festus Aloe, MD;  Location: [facility];  Service: Urology;  Laterality: Left;  . EXTRACORPOREAL SHOCK WAVE LITHOTRIPSY Left 06/26/2019   Procedure: EXTRACORPOREAL SHOCK WAVE LITHOTRIPSY (ESWL);  Surgeon: Festus Aloe, MD;  Location: [facility];  Service: Urology;  Laterality: Left;   The following portions of the patient's history were reviewed and updated as appropriate: allergies, current medications, past family history, past medical  history, past social history, past surgical history and problem list.   Review of Systems:  Pertinent items noted in HPI and remainder of comprehensive ROS otherwise negative.  Physical Exam:   General:  Alert, oriented and cooperative.   Mental Status: Normal mood and affect perceived. Normal judgment and thought content.  Physical exam deferred due to nature of the encounter  Labs and Imaging No results found for this or any previous visit (from the past 336 hour(s)). No results found.    Assessment and Plan:     1. Right lower quadrant abdominal pain - CT ABDOMEN PELVIS W CONTRAST; Future Will ensure no hernia at incision site.  Will also show recurrence of endometrioma.    2.  Pt has in person visit in s few weeks for health maintenance.    I discussed the assessment and treatment plan with the patient. The patient was provided an opportunity to ask questions and all were answered. The patient agreed with the plan and demonstrated an understanding of the instructions.   The patient was advised to call back or seek an in-person evaluation/go to the ED if the symptoms worsen or if the condition fails to improve as anticipated.  I provided 15 minutes of non-face-to-face time during this encounter.   Ashley [place], MD [facility], [place]

## 2020-01-12 ENCOUNTER — Other Ambulatory Visit: Payer: Self-pay | Admitting: Obstetrics & Gynecology

## 2020-01-12 DIAGNOSIS — R1031 Right lower quadrant pain: Secondary | ICD-10-CM

## 2020-01-15 ENCOUNTER — Telehealth: Payer: BLUE CROSS/BLUE SHIELD | Admitting: Obstetrics & Gynecology

## 2020-01-17 ENCOUNTER — Ambulatory Visit (INDEPENDENT_AMBULATORY_CARE_PROVIDER_SITE_OTHER): Payer: BLUE CROSS/BLUE SHIELD

## 2020-01-17 ENCOUNTER — Other Ambulatory Visit: Payer: Self-pay

## 2020-01-17 DIAGNOSIS — R1031 Right lower quadrant pain: Secondary | ICD-10-CM

## 2020-01-17 DIAGNOSIS — R109 Unspecified abdominal pain: Secondary | ICD-10-CM | POA: Diagnosis not present

## 2020-01-17 LAB — I-STAT CREATININE (MANUAL ENTRY): Creatinine, Ser: 0.8 (ref 0.50–1.10)

## 2020-01-17 MED ORDER — IOHEXOL 300 MG/ML  SOLN
100.0000 mL | Freq: Once | INTRAMUSCULAR | Status: AC | PRN
  Administered 2020-01-17: 100 mL via INTRAVENOUS

## 2020-01-26 ENCOUNTER — Other Ambulatory Visit: Payer: BLUE CROSS/BLUE SHIELD

## 2020-01-29 ENCOUNTER — Other Ambulatory Visit: Payer: Self-pay

## 2020-01-29 ENCOUNTER — Encounter: Payer: Self-pay | Admitting: Obstetrics & Gynecology

## 2020-01-29 ENCOUNTER — Other Ambulatory Visit (HOSPITAL_COMMUNITY)
Admission: RE | Admit: 2020-01-29 | Discharge: 2020-01-29 | Disposition: A | Discharge status: Home / Self Care | Payer: BLUE CROSS/BLUE SHIELD | Source: Ambulatory Visit | Attending: Obstetrics & Gynecology | Admitting: Obstetrics & Gynecology

## 2020-01-29 ENCOUNTER — Ambulatory Visit (INDEPENDENT_AMBULATORY_CARE_PROVIDER_SITE_OTHER): Payer: BLUE CROSS/BLUE SHIELD | Admitting: Obstetrics & Gynecology

## 2020-01-29 VITALS — BP 157/102 | HR 85 | Ht 67.0 in | Wt 192.0 lb

## 2020-01-29 DIAGNOSIS — E8881 Metabolic syndrome: Secondary | ICD-10-CM | POA: Diagnosis not present

## 2020-01-29 DIAGNOSIS — Z01411 Encounter for gynecological examination (general) (routine) with abnormal findings: Secondary | ICD-10-CM | POA: Insufficient documentation

## 2020-01-29 DIAGNOSIS — E88819 Insulin resistance, unspecified: Secondary | ICD-10-CM

## 2020-01-29 DIAGNOSIS — N809 Endometriosis, unspecified: Secondary | ICD-10-CM

## 2020-01-29 DIAGNOSIS — N870 Mild cervical dysplasia: Secondary | ICD-10-CM | POA: Diagnosis not present

## 2020-01-29 MED ORDER — METFORMIN HCL 500 MG PO TABS: 500.0000 mg | ORAL_TABLET | Freq: Two times a day (BID) | ORAL | 1 refills | Status: DC

## 2020-01-29 MED FILL — ATORVASTATIN CALCIUM 10 MG: 10 | 90 days supply | Qty: 90 | Fill #0

## 2020-01-29 NOTE — Progress Notes (Signed)
Subjective:     Ashley Edwards is a 49 y.o. female here for a routine exam.  Current complaints: pain over incision right inguinal area.  Amenorrhea from progestins (for endometriosis).  Wants to lose weight.  Insulin resistance with fasting 106 at PCP.  No recent Hgb A1C   Gynecologic History No LMP recorded. (Menstrual status: Perimenopausal). Contraception: oral progesterone-only contraceptive Last Pap: 2020. Results were: ASCUS with HPV negative Last mammogram: 2021. Results were: normal  Obstetric History OB History  Gravida Para Term Preterm AB Living  4 3 3   1 3   SAB TAB Ectopic Multiple Live Births  1            # Outcome Date GA Lbr Len/2nd Weight Sex Delivery Anes PTL Lv  4 SAB           3 Term           2 Term           1 Term              The following portions of the patient's history were reviewed and updated as appropriate: allergies, current medications, past family history, past medical history, past social history, past surgical history and problem list.  Review of Systems Pertinent items noted in HPI and remainder of comprehensive ROS otherwise negative.    Objective:      Vitals:   01/29/20 0938  BP: (!) 157/102  Pulse: 85  Weight: 192 lb (87.1 kg)  Height: 5\' 7"  (1.702 m)   Vitals:  WNL General appearance: alert, cooperative and no distress  HEENT: Normocephalic, without obvious abnormality, atraumatic Eyes: negative Throat: lips, mucosa, and tongue normal; teeth and gums normal  Respiratory: Clear to auscultation bilaterally  CV: Regular rate and rhythm  Breasts:  Normal appearance, no masses or tenderness, no nipple retraction or dimpling  GI: Soft, non-tender; bowel sounds normal; no masses,  no organomegaly. Scar healed over right inguinal region.  No pain currently.    GU: External Genitalia:  Tanner V, no lesion Urethra:  No prolapse   Vagina: Pink, normal rugae, no blood or discharge  Cervix: No CMT, lesion on lower cervical os with  vessel seen biopsied today.    Uterus:  Normal size and contour, non tender  Adnexa: Normal, no masses, non tender  Musculoskeletal: No edema, redness or tenderness in the calves or thighs  Skin: No lesions or rash  Lymphatic: Axillary adenopathy: none     Psychiatric: Normal mood and behavior          Assessment:    Healthy female exam.    Plan:   Pap with cotesting Yearly mammograms Cervical biopsy on lwer edge cervix Check hgb A1C and creatinine.  Start metformin if creatinine is normal.  BP is elevated.  Pt takes BP at home and is normal.  She was nervous about coming    After informed consent was obtained cervix was brought into view.  Cervix was cleaned with Betadine and a Kevorkian biopsy forcep was used to take a sample of the lower edge of the cervix at approximately 8:00.  Hemostasis was achieved with silver nitrate.

## 2020-01-30 LAB — COMPREHENSIVE METABOLIC PANEL
AG Ratio: 1.6 (calc) (ref 1.0–2.5)
ALT: 23 U/L (ref 6–29)
AST: 23 U/L (ref 10–35)
Albumin: 4.4 g/dL (ref 3.6–5.1)
Alkaline phosphatase (APISO): 64 U/L (ref 31–125)
BUN: 18 mg/dL (ref 7–25)
CO2: 26 mmol/L (ref 20–32)
Calcium: 9.1 mg/dL (ref 8.6–10.2)
Chloride: 102 mmol/L (ref 98–110)
Creat: 0.89 mg/dL (ref 0.50–1.10)
Globulin: 2.7 g/dL (calc) (ref 1.9–3.7)
Glucose, Bld: 127 mg/dL — ABNORMAL HIGH (ref 65–99)
Potassium: 4.6 mmol/L (ref 3.5–5.3)
Sodium: 137 mmol/L (ref 135–146)
Total Bilirubin: 0.5 mg/dL (ref 0.2–1.2)
Total Protein: 7.1 g/dL (ref 6.1–8.1)

## 2020-01-30 LAB — HEMOGLOBIN A1C
Hgb A1c MFr Bld: 5.3 % of total Hgb (ref <5.7)
Mean Plasma Glucose: 105 (calc)
eAG (mmol/L): 5.8 (calc)

## 2020-01-31 LAB — CYTOLOGY - PAP
Comment: NEGATIVE
Diagnosis: NEGATIVE
Diagnosis: REACTIVE
High risk HPV: NEGATIVE

## 2020-02-06 LAB — SURGICAL PATHOLOGY

## 2020-02-12 MED FILL — ESCITALOPRAM 10 MG TABLET: 10 | 90 days supply | Qty: 90 | Fill #1

## 2020-02-12 MED FILL — DILTIAZEM HCL ER COATED BEA: 120 | 90 days supply | Qty: 90 | Fill #3

## 2020-02-12 MED FILL — TELMISARTAN 20 MG TABS: 20 | 90 days supply | Qty: 90 | Fill #3

## 2020-02-13 ENCOUNTER — Other Ambulatory Visit: Payer: Self-pay | Admitting: Obstetrics & Gynecology

## 2020-02-19 ENCOUNTER — Other Ambulatory Visit: Payer: Self-pay | Admitting: Obstetrics & Gynecology

## 2020-02-19 MED ORDER — NORETHINDRONE 0.35 MG PO TABS: 1.0000 | ORAL_TABLET | Freq: Every day | ORAL | 12 refills | Status: DC

## 2020-02-19 MED FILL — NORETHINDRONE 0.35 MG TABS: 0.35 | 28 days supply | Qty: 28 | Fill #0

## 2020-02-21 ENCOUNTER — Encounter: Payer: BLUE CROSS/BLUE SHIELD | Admitting: Gastroenterology

## 2020-02-23 ENCOUNTER — Other Ambulatory Visit: Payer: Self-pay | Admitting: Obstetrics & Gynecology

## 2020-02-23 DIAGNOSIS — E8881 Metabolic syndrome: Secondary | ICD-10-CM

## 2020-02-23 MED ORDER — OZEMPIC (0.25 OR 0.5 MG/DOSE) 2 MG/1.5ML ~~LOC~~ SOPN: PEN_INJECTOR | SUBCUTANEOUS | 3 refills | Status: DC

## 2020-02-23 NOTE — Progress Notes (Signed)
Pt has metabolic syndrome and looking for management.  Metformin did not help.  Will try ozempic

## 2020-02-28 DIAGNOSIS — Z03818 Encounter for observation for suspected exposure to other biological agents ruled out: Secondary | ICD-10-CM | POA: Diagnosis not present

## 2020-02-28 DIAGNOSIS — Z20822 Contact with and (suspected) exposure to covid-19: Secondary | ICD-10-CM | POA: Diagnosis not present

## 2020-03-22 ENCOUNTER — Other Ambulatory Visit: Payer: Self-pay

## 2020-03-22 ENCOUNTER — Ambulatory Visit (AMBULATORY_SURGERY_CENTER): Payer: BLUE CROSS/BLUE SHIELD | Admitting: *Deleted

## 2020-03-22 VITALS — Ht 67.5 in | Wt 177.0 lb

## 2020-03-22 DIAGNOSIS — Z1211 Encounter for screening for malignant neoplasm of colon: Secondary | ICD-10-CM

## 2020-03-22 MED ORDER — PLENVU 140 G PO SOLR: 1.0000 | Freq: Once | ORAL | 0 refills | Status: AC

## 2020-03-22 NOTE — Progress Notes (Signed)

## 2020-03-22 NOTE — Progress Notes (Signed)
Virtual visit complete. Instructions sent through [place]. Patient cancelled EGD, only wants colonoscopy.

## 2020-03-26 MED FILL — NORETHINDRONE 0.35 MG TABS: 0.35 | 28 days supply | Qty: 28 | Fill #1

## 2020-03-27 ENCOUNTER — Ambulatory Visit: Payer: BLUE CROSS/BLUE SHIELD

## 2020-04-01 ENCOUNTER — Encounter: Payer: Self-pay | Admitting: Gastroenterology

## 2020-04-05 ENCOUNTER — Other Ambulatory Visit: Payer: Self-pay | Admitting: Gastroenterology

## 2020-04-05 ENCOUNTER — Encounter: Payer: BLUE CROSS/BLUE SHIELD | Admitting: Gastroenterology

## 2020-04-05 ENCOUNTER — Encounter: Payer: Self-pay | Admitting: Gastroenterology

## 2020-04-05 ENCOUNTER — Ambulatory Visit (AMBULATORY_SURGERY_CENTER): Payer: BLUE CROSS/BLUE SHIELD | Admitting: Gastroenterology

## 2020-04-05 ENCOUNTER — Other Ambulatory Visit: Payer: Self-pay

## 2020-04-05 VITALS — BP 118/84 | HR 73 | Temp 97.5°F | Resp 13 | Ht 67.0 in | Wt 177.0 lb

## 2020-04-05 DIAGNOSIS — Z1211 Encounter for screening for malignant neoplasm of colon: Secondary | ICD-10-CM | POA: Diagnosis not present

## 2020-04-05 DIAGNOSIS — D127 Benign neoplasm of rectosigmoid junction: Secondary | ICD-10-CM

## 2020-04-05 MED ORDER — SODIUM CHLORIDE 0.9 % IV SOLN: 500.0000 mL | Freq: Once | INTRAVENOUS | Status: DC

## 2020-04-05 NOTE — Op Note (Signed)
[facility] Patient Name: Ashley Edwards Procedure Date: 04/05/2020 10:56 AM MRN: [id number] Endoscopist: Mauri Pole , MD Age: 50 Referring MD:  Date of Birth: 1970-10-22 Gender: Female Account #: [id number] Procedure:                Colonoscopy Indications:              Screening for colorectal malignant neoplasm Medicines:                Monitored Anesthesia Care Procedure:                Pre-Anesthesia Assessment:                           - Prior to the procedure, a History and Physical                            was performed, and patient medications and                            allergies were reviewed. The patient's tolerance of                            previous anesthesia was also reviewed. The risks                            and benefits of the procedure and the sedation                            options and risks were discussed with the patient.                            All questions were answered, and informed consent                            was obtained. Prior Anticoagulants: The patient has                            taken no previous anticoagulant or antiplatelet                            agents. ASA Grade Assessment: II - A patient with                            mild systemic disease. After reviewing the risks                            and benefits, the patient was deemed in                            satisfactory condition to undergo the procedure.                           After obtaining informed consent, the colonoscope  was passed under direct vision. Throughout the                            procedure, the patient's blood pressure, pulse, and                            oxygen saturations were monitored continuously. The                            Olympus PCF-[id number]) Colonoscope was                            introduced through the anus and advanced to the the                            cecum,  identified by appendiceal orifice and                            ileocecal valve. The colonoscopy was performed                            without difficulty. The patient tolerated the                            procedure well. The quality of the bowel                            preparation was excellent. The ileocecal valve,                            appendiceal orifice, and rectum were photographed. Scope In: 11:04:04 AM Scope Out: 11:17:43 AM Scope Withdrawal Time: 0 hours 5 minutes 45 seconds  Total Procedure Duration: 0 hours 13 minutes 39 seconds  Findings:                 The perianal and digital rectal examinations were                            normal.                           A 11 mm polyp was found in the recto-sigmoid colon.                            The polyp was semi-pedunculated. The polyp was                            removed with a hot snare. Resection and retrieval                            were complete.                           Non-bleeding external and internal hemorrhoids were  found during retroflexion. The hemorrhoids were                            medium-sized.                           The exam was otherwise without abnormality. Complications:            No immediate complications. Estimated Blood Loss:     Estimated blood loss was minimal. Impression:               - One 11 mm polyp at the recto-sigmoid colon,                            removed with a hot snare. Resected and retrieved.                           - Non-bleeding external and internal hemorrhoids.                           - The examination was otherwise normal. Recommendation:           - Patient has a contact number available for                            emergencies. The signs and symptoms of potential                            delayed complications were discussed with the                            patient. Return to normal activities tomorrow.                             Written discharge instructions were provided to the                            patient.                           - Resume previous diet.                           - Continue present medications.                           - Await pathology results.                           - Repeat colonoscopy in 3 years for surveillance                            based on pathology results. Mauri Pole, MD 04/05/2020 11:21:20 AM This report has been signed electronically.

## 2020-04-05 NOTE — Progress Notes (Signed)
Called to room to assist during endoscopic procedure.  Patient ID and intended procedure confirmed with present staff. Received instructions for my participation in the procedure from the performing physician.  

## 2020-04-05 NOTE — Patient Instructions (Signed)
HANDOUTS PROVIDED ON: Polyps and hemorrhoids  The polyps removed today have been sent for pathology.  The results can take 1-3 weeks to receive.  When your next colonoscopy should occur will be based on the pathology results.    You may resume your previous diet and medication schedule.  Thank you for allowing us to care for you today!!!     YOU HAD AN ENDOSCOPIC PROCEDURE TODAY AT THE [facility]:   Refer to the procedure report that was given to you for any specific questions about what was found during the examination.  If the procedure report does not answer your questions, please call your gastroenterologist to clarify.  If you requested that your care partner not be given the details of your procedure findings, then the procedure report has been included in a sealed envelope for you to review at your convenience later.  YOU SHOULD EXPECT: Some feelings of bloating in the abdomen. Passage of more gas than usual.  Walking can help get rid of the air that was put into your GI tract during the procedure and reduce the bloating. If you had a lower endoscopy (such as a colonoscopy or flexible sigmoidoscopy) you may notice spotting of blood in your stool or on the toilet paper. If you underwent a bowel prep for your procedure, you may not have a normal bowel movement for a few days.  Please Note:  You might notice some irritation and congestion in your nose or some drainage.  This is from the oxygen used during your procedure.  There is no need for concern and it should clear up in a day or so.  SYMPTOMS TO REPORT IMMEDIATELY:   Following lower endoscopy (colonoscopy or flexible sigmoidoscopy):  Excessive amounts of blood in the stool  Significant tenderness or worsening of abdominal pains  Swelling of the abdomen that is new, acute  Fever of 100F or higher   For urgent or emergent issues, a gastroenterologist can be reached at any hour by calling [phone]. Do not use  MyChart messaging for urgent concerns.    DIET:  We do recommend a small meal at first, but then you may proceed to your regular diet.  Drink plenty of fluids but you should avoid alcoholic beverages for 24 hours.  ACTIVITY:  You should plan to take it easy for the rest of today and you should NOT DRIVE or use heavy machinery until tomorrow (because of the sedation medicines used during the test).    FOLLOW UP: Our staff will call the number listed on your records 48-72 hours following your procedure to check on you and address any questions or concerns that you may have regarding the information given to you following your procedure. If we do not reach you, we will leave a message.  We will attempt to reach you two times.  During this call, we will ask if you have developed any symptoms of COVID 19. If you develop any symptoms (ie: fever, flu-like symptoms, shortness of breath, cough etc.) before then, please call [phone].  If you test positive for Covid 19 in the 2 weeks post procedure, please call and report this information to us.    If any biopsies were taken you will be contacted by phone or by letter within the next 1-3 weeks.  Please call us at [phone] if you have not heard about the biopsies in 3 weeks.    SIGNATURES/CONFIDENTIALITY: You and/or your care partner have signed paperwork   which will be entered into your electronic medical record.  These signatures attest to the fact that that the information above on your After Visit Summary has been reviewed and is understood.  Full responsibility of the confidentiality of this discharge information lies with you and/or your care-partner.

## 2020-04-05 NOTE — Progress Notes (Signed)
To PACU, VSS. Report to [email or web address] 

## 2020-04-05 NOTE — Progress Notes (Signed)
Medical history reviewed with no changes noted. VS assessed by C.W 

## 2020-04-09 ENCOUNTER — Telehealth: Payer: Self-pay

## 2020-04-09 NOTE — Telephone Encounter (Signed)
  Follow up Call-  Call back number 04/05/2020  Post procedure Call Back phone  # [phone]  Permission to leave phone message Yes  Some recent data might be hidden     Patient questions:  Do you have a fever, pain , or abdominal swelling? No. Pain Score  0 *  Have you tolerated food without any problems? Yes.    Have you been able to return to your normal activities? Yes.    Do you have any questions about your discharge instructions: Diet   No. Medications  No. Follow up visit  No.  Do you have questions or concerns about your Care? No.  Actions: * If pain score is 4 or above: No action needed, pain <4.  1. Have you developed a fever since your procedure? no  2.   Have you had an respiratory symptoms (SOB or cough) since your procedure? no  3.   Have you tested positive for COVID 19 since your procedure no  4.   Have you had any family members/close contacts diagnosed with the COVID 19 since your procedure?  no   If yes to any of these questions please route to Joylene John, RN and Joella Prince, RN

## 2020-04-26 ENCOUNTER — Other Ambulatory Visit: Payer: Self-pay | Admitting: Urology

## 2020-04-26 DIAGNOSIS — N201 Calculus of ureter: Secondary | ICD-10-CM | POA: Diagnosis not present

## 2020-04-26 DIAGNOSIS — R8271 Bacteriuria: Secondary | ICD-10-CM | POA: Diagnosis not present

## 2020-04-29 ENCOUNTER — Other Ambulatory Visit (HOSPITAL_COMMUNITY)
Admission: RE | Admit: 2020-04-29 | Discharge: 2020-04-29 | Disposition: A | Discharge status: Home / Self Care | Payer: BLUE CROSS/BLUE SHIELD | Source: Ambulatory Visit | Attending: Urology | Admitting: Urology

## 2020-04-29 DIAGNOSIS — Z01812 Encounter for preprocedural laboratory examination: Secondary | ICD-10-CM | POA: Insufficient documentation

## 2020-04-29 DIAGNOSIS — I10 Essential (primary) hypertension: Secondary | ICD-10-CM | POA: Diagnosis not present

## 2020-04-29 DIAGNOSIS — Z841 Family history of disorders of kidney and ureter: Secondary | ICD-10-CM | POA: Diagnosis not present

## 2020-04-29 DIAGNOSIS — Z79899 Other long term (current) drug therapy: Secondary | ICD-10-CM | POA: Diagnosis not present

## 2020-04-29 DIAGNOSIS — Z885 Allergy status to narcotic agent status: Secondary | ICD-10-CM | POA: Diagnosis not present

## 2020-04-29 DIAGNOSIS — N202 Calculus of kidney with calculus of ureter: Secondary | ICD-10-CM | POA: Diagnosis not present

## 2020-04-29 DIAGNOSIS — Z8249 Family history of ischemic heart disease and other diseases of the circulatory system: Secondary | ICD-10-CM | POA: Diagnosis not present

## 2020-04-29 DIAGNOSIS — Z20822 Contact with and (suspected) exposure to covid-19: Secondary | ICD-10-CM | POA: Insufficient documentation

## 2020-04-29 DIAGNOSIS — N201 Calculus of ureter: Secondary | ICD-10-CM | POA: Diagnosis not present

## 2020-04-29 LAB — SARS CORONAVIRUS 2 (TAT 6-24 HRS): SARS Coronavirus 2: NEGATIVE

## 2020-04-30 ENCOUNTER — Encounter: Payer: Self-pay | Admitting: Gastroenterology

## 2020-04-30 NOTE — Progress Notes (Signed)
Talked with patient instructions given cl. Liquids until 0600. Husband is the driver arrival time 0746

## 2020-05-01 MED FILL — ATORVASTATIN CALCIUM 10 MG: 10 | 90 days supply | Qty: 90 | Fill #1

## 2020-05-01 MED FILL — ESCITALOPRAM 10 MG TABLET: 10 | 90 days supply | Qty: 90 | Fill #2

## 2020-05-01 MED FILL — NORETHINDRONE 0.35 MG TABS: 0.35 | 28 days supply | Qty: 28 | Fill #2

## 2020-05-02 ENCOUNTER — Encounter (HOSPITAL_BASED_OUTPATIENT_CLINIC_OR_DEPARTMENT_OTHER)
Admission: RE | Disposition: A | Discharge status: Home / Self Care | Payer: Self-pay | Source: Home / Self Care | Attending: Urology

## 2020-05-02 ENCOUNTER — Ambulatory Visit (HOSPITAL_BASED_OUTPATIENT_CLINIC_OR_DEPARTMENT_OTHER)
Admission: RE | Admit: 2020-05-02 | Discharge: 2020-05-02 | Disposition: A | Discharge status: Home / Self Care | Payer: BLUE CROSS/BLUE SHIELD | Attending: Urology | Admitting: Urology

## 2020-05-02 ENCOUNTER — Other Ambulatory Visit: Payer: Self-pay

## 2020-05-02 ENCOUNTER — Encounter (HOSPITAL_BASED_OUTPATIENT_CLINIC_OR_DEPARTMENT_OTHER): Payer: Self-pay | Admitting: Urology

## 2020-05-02 ENCOUNTER — Ambulatory Visit: Payer: BLUE CROSS/BLUE SHIELD

## 2020-05-02 ENCOUNTER — Ambulatory Visit (HOSPITAL_COMMUNITY): Payer: BLUE CROSS/BLUE SHIELD

## 2020-05-02 DIAGNOSIS — Z885 Allergy status to narcotic agent status: Secondary | ICD-10-CM | POA: Insufficient documentation

## 2020-05-02 DIAGNOSIS — Z841 Family history of disorders of kidney and ureter: Secondary | ICD-10-CM | POA: Diagnosis not present

## 2020-05-02 DIAGNOSIS — Z20822 Contact with and (suspected) exposure to covid-19: Secondary | ICD-10-CM | POA: Diagnosis not present

## 2020-05-02 DIAGNOSIS — N201 Calculus of ureter: Secondary | ICD-10-CM

## 2020-05-02 DIAGNOSIS — N202 Calculus of kidney with calculus of ureter: Secondary | ICD-10-CM | POA: Diagnosis not present

## 2020-05-02 DIAGNOSIS — I10 Essential (primary) hypertension: Secondary | ICD-10-CM | POA: Diagnosis not present

## 2020-05-02 DIAGNOSIS — Z8249 Family history of ischemic heart disease and other diseases of the circulatory system: Secondary | ICD-10-CM | POA: Insufficient documentation

## 2020-05-02 DIAGNOSIS — Z79899 Other long term (current) drug therapy: Secondary | ICD-10-CM | POA: Diagnosis not present

## 2020-05-02 DIAGNOSIS — Z87442 Personal history of urinary calculi: Secondary | ICD-10-CM | POA: Diagnosis not present

## 2020-05-02 DIAGNOSIS — Z01818 Encounter for other preprocedural examination: Secondary | ICD-10-CM | POA: Diagnosis not present

## 2020-05-02 HISTORY — PX: EXTRACORPOREAL SHOCK WAVE LITHOTRIPSY: SHX1557

## 2020-05-02 LAB — POCT PREGNANCY, URINE: Preg Test, Ur: NEGATIVE

## 2020-05-02 SURGERY — LITHOTRIPSY, ESWL
Anesthesia: LOCAL | Laterality: Right

## 2020-05-02 MED ORDER — SODIUM CHLORIDE 0.9 % IV SOLN: INTRAVENOUS | Status: DC

## 2020-05-02 MED ORDER — DIPHENHYDRAMINE HCL 25 MG PO CAPS
ORAL_CAPSULE | ORAL | Status: AC
  Filled 2020-05-02: qty 1

## 2020-05-02 MED ORDER — DIPHENHYDRAMINE HCL 25 MG PO CAPS
25.0000 mg | ORAL_CAPSULE | ORAL | Status: AC
  Administered 2020-05-02: 25 mg via ORAL

## 2020-05-02 MED ORDER — CIPROFLOXACIN HCL 500 MG PO TABS
ORAL_TABLET | ORAL | Status: AC
  Filled 2020-05-02: qty 1

## 2020-05-02 MED ORDER — DIAZEPAM 5 MG PO TABS
ORAL_TABLET | ORAL | Status: AC
  Filled 2020-05-02: qty 2

## 2020-05-02 MED ORDER — TRAMADOL HCL 50 MG PO TABS: 50.0000 mg | ORAL_TABLET | Freq: Four times a day (QID) | ORAL | 0 refills | Status: DC | PRN

## 2020-05-02 MED ORDER — CIPROFLOXACIN HCL 500 MG PO TABS
500.0000 mg | ORAL_TABLET | ORAL | Status: AC
  Administered 2020-05-02: 500 mg via ORAL

## 2020-05-02 MED ORDER — TAMSULOSIN HCL 0.4 MG PO CAPS: 0.4000 mg | ORAL_CAPSULE | Freq: Every day | ORAL | 0 refills | Status: DC

## 2020-05-02 MED ORDER — DIAZEPAM 5 MG PO TABS
10.0000 mg | ORAL_TABLET | ORAL | Status: AC
  Administered 2020-05-02: 10 mg via ORAL

## 2020-05-02 NOTE — H&P (Signed)
Ashley Edwards saw Dr. Karsten Ro and Dr. Tresa Moore --   1 - Recurrent Urolithiasis -  Pre 2021 - MET x several, SWL x 1.  06/2019 - SWL for 35m left distal stone, RUP 9101m LUP 37m72memain  08/2019 - she passed a sizable stone on he left (the 9 mm LUP stone)  01/2020 CT - only 8 mm RLP stone remain   2 - Medical Stone Disease -  Eval 2019 - equivocal  Eval 2021: BMP, PTH, Urate again normal. Composition 80% CaOx / 20% CaPO4; 24 Hr Urines - pending - pt did not f/u.   3 - Rt Renal Cyst - 5cm Rt mid lateral cyst by CT 2019. No prior contrast or [place]ages avail for review. Stable on CT 11/21.   PMH sig for HTN, oophorectomy for benign lesions, right groin mass excision (endometrioma), C spine fusion (no deficits). Her PCP is MarC[facility] Infante.   She is seen today for right flank pain and hematuria (red urine) that began 3-4 weeks ago. She had a CT scan 11/21 for RLQ pain at her incision site which revealed a 8 mm RLP stone and no left stones. KUB today with a 9 mm right mid ureteral stone and no other stones. Her pain has resolved but she noted continued red urine again today. No dysuria or fever.     ALLERGIES: hydromorphone    MEDICATIONS: Lipitor 10 mg tablet  Birth Control  Diltiazem 24Hr Er  Micardis  Ozempic     GU PSH: ESWL, Left - 06/26/2019, 2019     NON-GU PSH: Neck Surgery Remove Ovary(s)     GU PMH: Renal and ureteral calculus - 01/08/2020, - 08/07/2019 Renal cyst, Right - 2019      PMH Notes: Calculus disease: She has a history of calculus disease having undergone lithotripsy for a left distal ureteral stone on 09/02/17. She has known bilateral renal calculi.  Stone risk 2/19: Her serum studies revealed no abnormality specifically no elevation of her serum calcium or PTH. Her 24-hour urine revealed a volume of 1.56 L with elevated saturation of sodium urate and calcium phosphate.     NON-GU PMH: GERD Hypercholesterolemia Hypertension    FAMILY HISTORY: Blood In Urine - Brother,  Father CABG - Father Hypertension - Father nephrolithiasis - Father, Brother   SOCIAL HISTORY: Marital Status: Married Preferred Language: English; Ethnicity: Not Hispanic Or Latino; Race: White Current Smoking Status: Patient has never smoked.   Tobacco Use Assessment Completed: Used Tobacco in last 30 days?    REVIEW OF SYSTEMS:    GU Review Female:   gross hematuria. Patient denies frequent urination, hard to postpone urination, burning /pain with urination, get up at night to urinate, leakage of urine, stream starts and stops, trouble starting your stream, have to strain to urinate, and being pregnant.  Gastrointestinal (Upper):   Patient denies nausea, vomiting, and indigestion/ heartburn.  Gastrointestinal (Lower):   Patient denies diarrhea and constipation.  Constitutional:   Patient denies fever, night sweats, weight loss, and fatigue.  Skin:   Patient denies itching and skin rash/ lesion.  Eyes:   Patient denies blurred vision and double vision.  Ears/ Nose/ Throat:   Patient denies sore throat and sinus problems.  Hematologic/Lymphatic:   Patient denies swollen glands and easy bruising.  Cardiovascular:   Patient denies leg swelling and chest pains.  Respiratory:   Patient denies cough and shortness of breath.  Endocrine:   Patient denies excessive thirst.  Musculoskeletal:  Patient denies back pain and joint pain.  Neurological:   Patient denies headaches and dizziness.  Psychologic:   Patient denies depression and anxiety.   VITAL SIGNS:      04/26/2020 01:10 PM  Weight 181 lb / 82.1 kg  Height 67 in / 170.18 cm  BP 153/102 mmHg  Pulse 97 /min  Temperature 98.9 F / 37.1 C  BMI 28.3 kg/m   MULTI-SYSTEM PHYSICAL EXAMINATION:    Constitutional: Well-nourished. No physical deformities. Normally developed. Good grooming.  Neck: Neck symmetrical, not swollen. Normal tracheal position.  Respiratory: No labored breathing, no use of accessory muscles.   Cardiovascular:  Normal temperature, normal extremity pulses, no swelling, no varicosities.  Neurologic / Psychiatric: Oriented to time, oriented to place, oriented to person. No depression, no anxiety, no agitation.  Gastrointestinal: No mass, no tenderness, no rigidity, non obese abdomen.     PAST DATA REVIEW: None   PROCEDURES:         KUB - 74018  A single view of the abdomen is obtained.  Calculi:  9 mm right mid ureteral stone and no other stones      The bones appeared normal. The bowel gas pattern appeared normal. The soft tissues were unremarkable. Patient confirmed No Neulasta OnPro Device.            Urinalysis w/Scope - 81001 Dipstick Dipstick Cont'd Micro  Color: Red Bilirubin: Neg WBC/hpf: 0 - 5/hpf  Appearance: Clear Ketones: Neg RBC/hpf: 10 - 20/hpf  Specific Gravity: 1.010 Blood: 3+ Bacteria: Rare (0-9/hpf)  pH: 6.0 Protein: Trace Cystals: NS (Not Seen)  Glucose: Neg Urobilinogen: 0.2 Casts: NS (Not Seen)    Nitrites: Neg Trichomonas: Not Present    Leukocyte Esterase: 1+ Mucous: Not Present      Epithelial Cells: NS (Not Seen)      Yeast: NS (Not Seen)      Sperm: Not Present    Notes:  microscopic not concentrated     ASSESSMENT:      ICD-10 Details  1 GU:   Ureteral calculus - Y65.0 Acute, Uncomplicated - I showed her the KUB and we went over the nature r/b/a to right ESWL. We disc continued stone passage or URS. She wants to proceed with Right ESWL and will arrange for next week. Disc return precautions. Urine for cx as a precaution.    PLAN:           Orders Labs Urine Culture          Schedule Return Visit/Planned Activity: Next Available Appointment - Schedule Surgery

## 2020-05-02 NOTE — Discharge Instructions (Signed)

## 2020-05-03 ENCOUNTER — Encounter (HOSPITAL_BASED_OUTPATIENT_CLINIC_OR_DEPARTMENT_OTHER): Payer: Self-pay | Admitting: Urology

## 2020-05-14 ENCOUNTER — Other Ambulatory Visit (HOSPITAL_COMMUNITY): Payer: Self-pay | Admitting: Internal Medicine

## 2020-05-14 MED FILL — TELMISARTAN 20 MG TABS: 20 | 90 days supply | Qty: 90 | Fill #0

## 2020-05-14 MED FILL — DILTIAZEM HCL ER COATED BEA: 120 | 90 days supply | Qty: 90 | Fill #0

## 2020-05-15 ENCOUNTER — Ambulatory Visit: Payer: BLUE CROSS/BLUE SHIELD

## 2020-05-20 DIAGNOSIS — N201 Calculus of ureter: Secondary | ICD-10-CM | POA: Diagnosis not present

## 2020-05-24 ENCOUNTER — Ambulatory Visit (INDEPENDENT_AMBULATORY_CARE_PROVIDER_SITE_OTHER): Payer: BLUE CROSS/BLUE SHIELD

## 2020-05-24 ENCOUNTER — Other Ambulatory Visit: Payer: Self-pay

## 2020-05-24 DIAGNOSIS — Z1231 Encounter for screening mammogram for malignant neoplasm of breast: Secondary | ICD-10-CM | POA: Diagnosis not present

## 2020-05-24 DIAGNOSIS — Z01411 Encounter for gynecological examination (general) (routine) with abnormal findings: Secondary | ICD-10-CM

## 2020-05-27 DIAGNOSIS — D2262 Melanocytic nevi of left upper limb, including shoulder: Secondary | ICD-10-CM | POA: Diagnosis not present

## 2020-05-27 DIAGNOSIS — L820 Inflamed seborrheic keratosis: Secondary | ICD-10-CM | POA: Diagnosis not present

## 2020-05-27 DIAGNOSIS — D2261 Melanocytic nevi of right upper limb, including shoulder: Secondary | ICD-10-CM | POA: Diagnosis not present

## 2020-05-27 DIAGNOSIS — D485 Neoplasm of uncertain behavior of skin: Secondary | ICD-10-CM | POA: Diagnosis not present

## 2020-06-22 DIAGNOSIS — Z03818 Encounter for observation for suspected exposure to other biological agents ruled out: Secondary | ICD-10-CM | POA: Diagnosis not present

## 2020-06-22 DIAGNOSIS — Z20822 Contact with and (suspected) exposure to covid-19: Secondary | ICD-10-CM | POA: Diagnosis not present

## 2020-06-27 ENCOUNTER — Other Ambulatory Visit (HOSPITAL_COMMUNITY): Payer: Self-pay

## 2020-06-27 MED FILL — Norethindrone Tab 0.35 MG: ORAL | 28 days supply | Qty: 28 | Fill #0 | Status: AC

## 2020-08-02 ENCOUNTER — Other Ambulatory Visit (HOSPITAL_COMMUNITY): Payer: Self-pay

## 2020-08-02 MED ORDER — ESCITALOPRAM OXALATE 10 MG PO TABS
1.0000 | ORAL_TABLET | Freq: Every day | ORAL | 2 refills | Status: DC
  Filled 2020-08-02: qty 90, 90d supply, fill #0
  Filled 2020-11-05: qty 90, 90d supply, fill #1
  Filled 2021-02-10: qty 90, 90d supply, fill #2

## 2020-08-02 MED FILL — Norethindrone Tab 0.35 MG: ORAL | 28 days supply | Qty: 28 | Fill #1 | Status: AC

## 2020-08-02 MED FILL — Atorvastatin Calcium Tab 10 MG (Base Equivalent): ORAL | 90 days supply | Qty: 90 | Fill #0 | Status: AC

## 2020-08-02 MED FILL — Telmisartan Tab 20 MG: ORAL | 90 days supply | Qty: 90 | Fill #0 | Status: AC

## 2020-08-02 MED FILL — Diltiazem HCl Coated Beads Cap ER 24HR 120 MG: ORAL | 90 days supply | Qty: 90 | Fill #0 | Status: AC

## 2020-08-19 DIAGNOSIS — E785 Hyperlipidemia, unspecified: Secondary | ICD-10-CM | POA: Diagnosis not present

## 2020-08-19 DIAGNOSIS — Z125 Encounter for screening for malignant neoplasm of prostate: Secondary | ICD-10-CM | POA: Diagnosis not present

## 2020-08-19 DIAGNOSIS — R7301 Impaired fasting glucose: Secondary | ICD-10-CM | POA: Diagnosis not present

## 2020-08-23 DIAGNOSIS — N809 Endometriosis, unspecified: Secondary | ICD-10-CM | POA: Diagnosis not present

## 2020-08-23 DIAGNOSIS — E785 Hyperlipidemia, unspecified: Secondary | ICD-10-CM | POA: Diagnosis not present

## 2020-08-23 DIAGNOSIS — Z0000 Encounter for general adult medical examination without abnormal findings: Secondary | ICD-10-CM | POA: Diagnosis not present

## 2020-08-29 ENCOUNTER — Other Ambulatory Visit (HOSPITAL_COMMUNITY): Payer: Self-pay

## 2020-08-29 MED FILL — Norethindrone Tab 0.35 MG: ORAL | 28 days supply | Qty: 28 | Fill #2 | Status: AC

## 2020-09-25 ENCOUNTER — Other Ambulatory Visit (HOSPITAL_COMMUNITY): Payer: Self-pay

## 2020-09-25 MED FILL — Norethindrone Tab 0.35 MG: ORAL | 28 days supply | Qty: 28 | Fill #3 | Status: AC

## 2020-10-28 ENCOUNTER — Other Ambulatory Visit (HOSPITAL_COMMUNITY): Payer: Self-pay

## 2020-10-28 MED FILL — Norethindrone Tab 0.35 MG: ORAL | 28 days supply | Qty: 28 | Fill #4 | Status: AC

## 2020-11-05 ENCOUNTER — Other Ambulatory Visit (HOSPITAL_COMMUNITY): Payer: Self-pay

## 2020-11-05 MED ORDER — ATORVASTATIN CALCIUM 10 MG PO TABS
10.0000 mg | ORAL_TABLET | Freq: Every day | ORAL | 2 refills | Status: DC
  Filled 2020-11-05: qty 90, 90d supply, fill #0
  Filled 2021-02-10: qty 90, 90d supply, fill #1

## 2020-11-05 MED FILL — Telmisartan Tab 20 MG: ORAL | 90 days supply | Qty: 90 | Fill #1 | Status: AC

## 2020-11-05 MED FILL — Diltiazem HCl Coated Beads Cap ER 24HR 120 MG: ORAL | 90 days supply | Qty: 90 | Fill #1 | Status: AC

## 2020-11-12 DIAGNOSIS — M25522 Pain in left elbow: Secondary | ICD-10-CM | POA: Diagnosis not present

## 2020-11-12 DIAGNOSIS — M542 Cervicalgia: Secondary | ICD-10-CM | POA: Diagnosis not present

## 2020-11-12 DIAGNOSIS — M25521 Pain in right elbow: Secondary | ICD-10-CM | POA: Diagnosis not present

## 2020-11-19 DIAGNOSIS — M25522 Pain in left elbow: Secondary | ICD-10-CM | POA: Diagnosis not present

## 2020-11-19 DIAGNOSIS — M25521 Pain in right elbow: Secondary | ICD-10-CM | POA: Diagnosis not present

## 2020-11-19 DIAGNOSIS — M542 Cervicalgia: Secondary | ICD-10-CM | POA: Diagnosis not present

## 2020-11-25 ENCOUNTER — Other Ambulatory Visit (HOSPITAL_COMMUNITY): Payer: Self-pay

## 2020-11-25 MED FILL — Norethindrone Tab 0.35 MG: ORAL | 28 days supply | Qty: 28 | Fill #5 | Status: AC

## 2020-11-26 DIAGNOSIS — M25522 Pain in left elbow: Secondary | ICD-10-CM | POA: Diagnosis not present

## 2020-11-26 DIAGNOSIS — M542 Cervicalgia: Secondary | ICD-10-CM | POA: Diagnosis not present

## 2020-11-26 DIAGNOSIS — M25521 Pain in right elbow: Secondary | ICD-10-CM | POA: Diagnosis not present

## 2020-12-20 ENCOUNTER — Other Ambulatory Visit (HOSPITAL_COMMUNITY): Payer: Self-pay

## 2020-12-20 MED FILL — Norethindrone Tab 0.35 MG: ORAL | 28 days supply | Qty: 28 | Fill #6 | Status: AC

## 2021-01-16 ENCOUNTER — Other Ambulatory Visit (HOSPITAL_COMMUNITY): Payer: Self-pay

## 2021-01-16 DIAGNOSIS — N202 Calculus of kidney with calculus of ureter: Secondary | ICD-10-CM | POA: Diagnosis not present

## 2021-01-16 MED FILL — Norethindrone Tab 0.35 MG: ORAL | 28 days supply | Qty: 28 | Fill #7 | Status: AC

## 2021-02-10 ENCOUNTER — Other Ambulatory Visit (HOSPITAL_COMMUNITY): Payer: Self-pay

## 2021-02-10 MED FILL — Diltiazem HCl Coated Beads Cap ER 24HR 120 MG: ORAL | 90 days supply | Qty: 90 | Fill #2 | Status: AC

## 2021-02-10 MED FILL — Telmisartan Tab 20 MG: ORAL | 90 days supply | Qty: 90 | Fill #2 | Status: AC

## 2021-02-12 ENCOUNTER — Other Ambulatory Visit (HOSPITAL_COMMUNITY): Payer: Self-pay

## 2021-02-12 MED FILL — Norethindrone Tab 0.35 MG: ORAL | 28 days supply | Qty: 28 | Fill #8 | Status: AC

## 2021-03-19 ENCOUNTER — Other Ambulatory Visit: Payer: Self-pay | Admitting: Obstetrics & Gynecology

## 2021-03-19 ENCOUNTER — Other Ambulatory Visit (HOSPITAL_COMMUNITY): Payer: Self-pay

## 2021-03-22 ENCOUNTER — Other Ambulatory Visit (HOSPITAL_COMMUNITY): Payer: Self-pay

## 2021-03-26 ENCOUNTER — Other Ambulatory Visit (HOSPITAL_COMMUNITY): Payer: Self-pay

## 2021-03-29 ENCOUNTER — Other Ambulatory Visit (HOSPITAL_COMMUNITY): Payer: Self-pay

## 2021-03-29 ENCOUNTER — Other Ambulatory Visit: Payer: Self-pay | Admitting: Obstetrics & Gynecology

## 2021-04-10 ENCOUNTER — Other Ambulatory Visit (HOSPITAL_COMMUNITY): Payer: Self-pay

## 2021-04-14 ENCOUNTER — Other Ambulatory Visit: Payer: Self-pay | Admitting: Obstetrics & Gynecology

## 2021-04-14 DIAGNOSIS — B078 Other viral warts: Secondary | ICD-10-CM | POA: Diagnosis not present

## 2021-04-14 DIAGNOSIS — D485 Neoplasm of uncertain behavior of skin: Secondary | ICD-10-CM | POA: Diagnosis not present

## 2021-04-14 MED ORDER — OZEMPIC (0.25 OR 0.5 MG/DOSE) 2 MG/1.5ML ~~LOC~~ SOPN
PEN_INJECTOR | SUBCUTANEOUS | 3 refills | Status: DC
  Filled 2021-04-21: qty 1.5, 56d supply, fill #0

## 2021-04-14 NOTE — Progress Notes (Signed)
Moncerrat would like to restart Ozembic.  Rx sent to pharmacy.

## 2021-04-17 ENCOUNTER — Other Ambulatory Visit (HOSPITAL_COMMUNITY): Payer: Self-pay

## 2021-04-21 ENCOUNTER — Other Ambulatory Visit (HOSPITAL_COMMUNITY): Payer: Self-pay

## 2021-04-23 ENCOUNTER — Other Ambulatory Visit: Payer: Self-pay | Admitting: Obstetrics & Gynecology

## 2021-04-23 DIAGNOSIS — Z1231 Encounter for screening mammogram for malignant neoplasm of breast: Secondary | ICD-10-CM

## 2021-04-24 ENCOUNTER — Encounter: Payer: Self-pay | Admitting: *Deleted

## 2021-05-09 NOTE — Progress Notes (Signed)
Last Mammogram: 05/27/20 negative- next mammo scheduled for 06/05/21 Last Pap Smear:  01/29/20- negative Last Colon Screening;  Done 01/22 Seat Belts:   Yes Ashley Edwards Screen:   Yes Dental Check Up:  Yes Brush & Floss:  Yes   Subjective:     Ashley Edwards is a 51 y.o. female here for a routine exam.  Current complaints: weight gain, fatigued, not her normal self, some night sweats but much better than before.  Ashley Edwards stopped the [employer] and did not have a menses.  Patient has been calorie counting and consumes approximate 1200 cal a day.  She exercises 5 times a week and is noted to have good muscular volume.  Patient would like assistance with weight loss and has been on Ozempic in the past like this prescription refilled.  She is on Lexapro for 2 years--was placed when her youngest son diagnosed with type 1 DM.     Gynecologic History No LMP recorded. (Menstrual status: Perimenopausal). Contraception: none Last Pap: 2021. Results were: normal Last mammogram: 3/22. Results were: normal  Obstetric History OB History  Gravida Para Term Preterm AB Living  4 3 3   1 3   SAB IAB Ectopic Multiple Live Births  1            # Outcome Date GA Lbr Len/2nd Weight Sex Delivery Anes PTL Lv  4 SAB           3 Term           2 Term           1 Term              The following portions of the patient's history were reviewed and updated as appropriate: allergies, current medications, past family history, past medical history, past social history, past surgical history, and problem list.  Review of Systems Pertinent items noted in HPI and remainder of comprehensive ROS otherwise negative.    Objective:     Vitals:   05/12/21 0809  BP: (!) 150/97  Pulse: 80  Weight: 195 lb (88.5 kg)  Height: 5\' 7"  (1.702 m)   Vitals:  WNL General appearance: alert, cooperative and no distress  HEENT: Normocephalic, without obvious abnormality, atraumatic Eyes: negative Throat: lips, mucosa, and tongue  normal; teeth and gums normal  Respiratory: Clear to auscultation bilaterally  CV: Regular rate and rhythm  Breasts:  Normal appearance, no masses or tenderness, no nipple retraction or dimpling  GI: Soft, non-tender; bowel sounds normal; no masses,  no organomegaly  GU: External Genitalia:  Tanner V, no lesion Urethra:  No prolapse   Vagina: Pink, normal rugae, no blood or discharge  Cervix: No CMT, no lesion  Uterus:  Normal size and contour, non tender  Adnexa: Normal, no masses, non tender  Musculoskeletal: No edema, redness or tenderness in the calves or thighs  Skin: No lesions or rash  Lymphatic: Axillary adenopathy: none     Psychiatric: Normal mood and behavior        Assessment:    Healthy female exam.    Plan:    Pap smear Biopsy of area on cervix that is present from last year.  Path is not in computer; Will call path lab to have report updated.  Increase Lexapro to 20 mg daily FSH to ensure amenorrhea is due to menopause. Check TSH for fatigue Rx for ozembic and will call insurance company to appeal denial.  Pt has HTN and is obese by BMI (30)  Ashley Edwards might be getting too few calories (1200) given the amount she is exercising.  Advised to increase to 1600 calories and to not go long periods with fasting.

## 2021-05-12 ENCOUNTER — Encounter: Payer: Self-pay | Admitting: Obstetrics & Gynecology

## 2021-05-12 ENCOUNTER — Other Ambulatory Visit (HOSPITAL_COMMUNITY)
Admission: RE | Admit: 2021-05-12 | Discharge: 2021-05-12 | Disposition: A | Discharge status: Home / Self Care | Payer: BC Managed Care – PPO | Source: Ambulatory Visit | Attending: Obstetrics & Gynecology | Admitting: Obstetrics & Gynecology

## 2021-05-12 ENCOUNTER — Other Ambulatory Visit: Payer: Self-pay

## 2021-05-12 ENCOUNTER — Ambulatory Visit (INDEPENDENT_AMBULATORY_CARE_PROVIDER_SITE_OTHER): Payer: BC Managed Care – PPO | Admitting: Obstetrics & Gynecology

## 2021-05-12 ENCOUNTER — Other Ambulatory Visit (HOSPITAL_COMMUNITY): Payer: Self-pay

## 2021-05-12 VITALS — BP 150/97 | HR 80 | Ht 67.0 in | Wt 195.0 lb

## 2021-05-12 DIAGNOSIS — N912 Amenorrhea, unspecified: Secondary | ICD-10-CM | POA: Diagnosis not present

## 2021-05-12 DIAGNOSIS — Z01419 Encounter for gynecological examination (general) (routine) without abnormal findings: Secondary | ICD-10-CM

## 2021-05-12 DIAGNOSIS — R5383 Other fatigue: Secondary | ICD-10-CM | POA: Diagnosis not present

## 2021-05-12 DIAGNOSIS — N889 Noninflammatory disorder of cervix uteri, unspecified: Secondary | ICD-10-CM | POA: Diagnosis not present

## 2021-05-12 MED ORDER — ESCITALOPRAM OXALATE 20 MG PO TABS
20.0000 mg | ORAL_TABLET | Freq: Every day | ORAL | 6 refills | Status: DC
  Filled 2021-05-12: qty 30, 30d supply, fill #0
  Filled 2021-06-26: qty 30, 30d supply, fill #1
  Filled 2021-07-28: qty 30, 30d supply, fill #2
  Filled 2021-08-19: qty 30, 30d supply, fill #3
  Filled 2021-10-01: qty 30, 30d supply, fill #4
  Filled 2021-11-10: qty 30, 30d supply, fill #5
  Filled 2021-12-09: qty 30, 30d supply, fill #6

## 2021-05-14 LAB — CYTOLOGY - PAP
Comment: NEGATIVE
Diagnosis: NEGATIVE
High risk HPV: NEGATIVE

## 2021-05-14 LAB — SURGICAL PATHOLOGY

## 2021-05-15 ENCOUNTER — Other Ambulatory Visit (HOSPITAL_COMMUNITY): Payer: Self-pay

## 2021-05-15 ENCOUNTER — Other Ambulatory Visit: Payer: Self-pay | Admitting: Obstetrics & Gynecology

## 2021-05-15 MED ORDER — OZEMPIC (1 MG/DOSE) 4 MG/3ML ~~LOC~~ SOPN
1.0000 mg | PEN_INJECTOR | SUBCUTANEOUS | 5 refills | Status: DC
  Filled 2021-05-15: qty 3, 28d supply, fill #0

## 2021-05-16 ENCOUNTER — Other Ambulatory Visit: Payer: Self-pay | Admitting: Obstetrics & Gynecology

## 2021-05-16 ENCOUNTER — Other Ambulatory Visit (HOSPITAL_COMMUNITY): Payer: Self-pay

## 2021-05-16 DIAGNOSIS — N912 Amenorrhea, unspecified: Secondary | ICD-10-CM | POA: Diagnosis not present

## 2021-05-17 LAB — FOLLICLE STIMULATING HORMONE: FSH: 6.1 m[IU]/mL

## 2021-05-19 ENCOUNTER — Telehealth: Payer: Self-pay | Admitting: *Deleted

## 2021-05-19 NOTE — Telephone Encounter (Signed)
Spoke with Melissa @ [facility] to add on TSH to labwork done 05/16/21 per Dr Gala Romney. ?

## 2021-05-20 ENCOUNTER — Encounter: Payer: Self-pay | Admitting: Obstetrics & Gynecology

## 2021-05-20 LAB — TSH: TSH: 1.57 u[IU]/mL (ref 0.450–4.500)

## 2021-05-20 LAB — SPECIMEN STATUS REPORT

## 2021-05-23 ENCOUNTER — Other Ambulatory Visit (HOSPITAL_COMMUNITY): Payer: Self-pay

## 2021-05-23 MED ORDER — DILTIAZEM HCL ER COATED BEADS 120 MG PO CP24
120.0000 mg | ORAL_CAPSULE | Freq: Every evening | ORAL | 3 refills | Status: DC
  Filled 2021-05-23: qty 90, 90d supply, fill #0
  Filled 2021-08-19: qty 90, 90d supply, fill #1
  Filled 2021-11-19: qty 90, 90d supply, fill #2
  Filled 2022-02-16: qty 90, 90d supply, fill #3

## 2021-05-28 ENCOUNTER — Other Ambulatory Visit (HOSPITAL_COMMUNITY): Payer: Self-pay

## 2021-06-02 ENCOUNTER — Other Ambulatory Visit (HOSPITAL_COMMUNITY): Payer: Self-pay

## 2021-06-02 MED ORDER — ATORVASTATIN CALCIUM 10 MG PO TABS
10.0000 mg | ORAL_TABLET | Freq: Every day | ORAL | 2 refills | Status: DC
  Filled 2021-06-02: qty 90, 90d supply, fill #0
  Filled 2021-08-19: qty 90, 90d supply, fill #1
  Filled 2021-11-19: qty 90, 90d supply, fill #2

## 2021-06-04 ENCOUNTER — Other Ambulatory Visit (HOSPITAL_COMMUNITY): Payer: Self-pay

## 2021-06-05 ENCOUNTER — Other Ambulatory Visit: Payer: Self-pay

## 2021-06-05 ENCOUNTER — Ambulatory Visit (INDEPENDENT_AMBULATORY_CARE_PROVIDER_SITE_OTHER): Payer: BC Managed Care – PPO

## 2021-06-05 DIAGNOSIS — Z1231 Encounter for screening mammogram for malignant neoplasm of breast: Secondary | ICD-10-CM | POA: Diagnosis not present

## 2021-06-10 ENCOUNTER — Other Ambulatory Visit (HOSPITAL_COMMUNITY): Payer: Self-pay

## 2021-06-10 ENCOUNTER — Other Ambulatory Visit: Payer: Self-pay | Admitting: *Deleted

## 2021-06-10 MED ORDER — WEGOVY 1.7 MG/0.75ML ~~LOC~~ SOAJ
1.7000 mg | SUBCUTANEOUS | 2 refills | Status: DC
  Filled 2021-06-10: qty 3, 30d supply, fill #0
  Filled 2021-07-04: qty 3, 28d supply, fill #1
  Filled 2021-07-28: qty 3, 28d supply, fill #2

## 2021-06-10 NOTE — Progress Notes (Signed)
Spoke with Dr Gala Romney and [facility] to initiated Ashley Edwards for weight loss.  She has been paying out of pocket for Ozempic and due to her BMI>30 if a PA is done insurance may pay for the [facility]. ?

## 2021-06-11 ENCOUNTER — Encounter: Payer: Self-pay | Admitting: *Deleted

## 2021-06-12 ENCOUNTER — Other Ambulatory Visit (HOSPITAL_COMMUNITY): Payer: Self-pay

## 2021-06-12 MED ORDER — TELMISARTAN 20 MG PO TABS
20.0000 mg | ORAL_TABLET | Freq: Every evening | ORAL | 3 refills | Status: DC
  Filled 2021-06-12: qty 90, 90d supply, fill #0
  Filled 2021-08-19: qty 90, 90d supply, fill #1
  Filled 2021-12-09: qty 90, 90d supply, fill #2
  Filled 2022-02-16: qty 90, 90d supply, fill #3

## 2021-06-13 ENCOUNTER — Other Ambulatory Visit (HOSPITAL_COMMUNITY): Payer: Self-pay

## 2021-06-24 DIAGNOSIS — M545 Low back pain, unspecified: Secondary | ICD-10-CM | POA: Diagnosis not present

## 2021-06-24 DIAGNOSIS — M542 Cervicalgia: Secondary | ICD-10-CM | POA: Diagnosis not present

## 2021-06-24 DIAGNOSIS — M25522 Pain in left elbow: Secondary | ICD-10-CM | POA: Diagnosis not present

## 2021-06-24 DIAGNOSIS — M25521 Pain in right elbow: Secondary | ICD-10-CM | POA: Diagnosis not present

## 2021-06-27 ENCOUNTER — Other Ambulatory Visit (HOSPITAL_COMMUNITY): Payer: Self-pay

## 2021-06-30 DIAGNOSIS — L814 Other melanin hyperpigmentation: Secondary | ICD-10-CM | POA: Diagnosis not present

## 2021-06-30 DIAGNOSIS — L918 Other hypertrophic disorders of the skin: Secondary | ICD-10-CM | POA: Diagnosis not present

## 2021-06-30 DIAGNOSIS — M542 Cervicalgia: Secondary | ICD-10-CM | POA: Diagnosis not present

## 2021-06-30 DIAGNOSIS — M545 Low back pain, unspecified: Secondary | ICD-10-CM | POA: Diagnosis not present

## 2021-06-30 DIAGNOSIS — D225 Melanocytic nevi of trunk: Secondary | ICD-10-CM | POA: Diagnosis not present

## 2021-06-30 DIAGNOSIS — L821 Other seborrheic keratosis: Secondary | ICD-10-CM | POA: Diagnosis not present

## 2021-06-30 DIAGNOSIS — M25521 Pain in right elbow: Secondary | ICD-10-CM | POA: Diagnosis not present

## 2021-06-30 DIAGNOSIS — M25522 Pain in left elbow: Secondary | ICD-10-CM | POA: Diagnosis not present

## 2021-07-04 ENCOUNTER — Other Ambulatory Visit (HOSPITAL_COMMUNITY): Payer: Self-pay

## 2021-07-05 ENCOUNTER — Other Ambulatory Visit (HOSPITAL_COMMUNITY): Payer: Self-pay

## 2021-07-09 ENCOUNTER — Other Ambulatory Visit (HOSPITAL_COMMUNITY): Payer: Self-pay

## 2021-07-09 DIAGNOSIS — M25522 Pain in left elbow: Secondary | ICD-10-CM | POA: Diagnosis not present

## 2021-07-09 DIAGNOSIS — M545 Low back pain, unspecified: Secondary | ICD-10-CM | POA: Diagnosis not present

## 2021-07-09 DIAGNOSIS — M542 Cervicalgia: Secondary | ICD-10-CM | POA: Diagnosis not present

## 2021-07-09 DIAGNOSIS — M25521 Pain in right elbow: Secondary | ICD-10-CM | POA: Diagnosis not present

## 2021-07-28 ENCOUNTER — Other Ambulatory Visit (HOSPITAL_COMMUNITY): Payer: Self-pay

## 2021-07-30 ENCOUNTER — Other Ambulatory Visit: Payer: Self-pay | Admitting: Obstetrics & Gynecology

## 2021-07-30 ENCOUNTER — Other Ambulatory Visit (HOSPITAL_COMMUNITY): Payer: Self-pay

## 2021-08-04 ENCOUNTER — Other Ambulatory Visit: Payer: Self-pay | Admitting: Obstetrics & Gynecology

## 2021-08-04 ENCOUNTER — Other Ambulatory Visit (HOSPITAL_COMMUNITY): Payer: Self-pay

## 2021-08-04 ENCOUNTER — Telehealth: Payer: Self-pay | Admitting: *Deleted

## 2021-08-04 MED ORDER — WEGOVY 2.4 MG/0.75ML ~~LOC~~ SOAJ
2.4000 mg | SUBCUTANEOUS | 6 refills | Status: DC
  Filled 2021-08-04: qty 3, 28d supply, fill #0
  Filled 2021-08-19: qty 3, 28d supply, fill #1
  Filled 2021-10-01: qty 3, 28d supply, fill #2
  Filled 2021-10-28: qty 3, 28d supply, fill #3
  Filled 2021-11-19: qty 3, 28d supply, fill #4
  Filled 2021-12-29: qty 3, 28d supply, fill #5
  Filled 2022-01-12 – 2022-01-23 (×2): qty 3, 28d supply, fill #6

## 2021-08-04 NOTE — Telephone Encounter (Signed)
RX sent for Wegovy 2.4 mg to [facility] outpatient pharmacy

## 2021-08-13 ENCOUNTER — Other Ambulatory Visit (HOSPITAL_COMMUNITY): Payer: Self-pay

## 2021-08-19 ENCOUNTER — Other Ambulatory Visit (HOSPITAL_COMMUNITY): Payer: Self-pay

## 2021-08-20 ENCOUNTER — Other Ambulatory Visit (HOSPITAL_COMMUNITY): Payer: Self-pay

## 2021-08-25 ENCOUNTER — Other Ambulatory Visit (HOSPITAL_COMMUNITY): Payer: Self-pay

## 2021-09-03 ENCOUNTER — Other Ambulatory Visit (HOSPITAL_COMMUNITY): Payer: Self-pay

## 2021-09-04 ENCOUNTER — Other Ambulatory Visit (HOSPITAL_COMMUNITY): Payer: Self-pay

## 2021-10-01 ENCOUNTER — Other Ambulatory Visit (HOSPITAL_COMMUNITY): Payer: Self-pay

## 2021-10-06 ENCOUNTER — Other Ambulatory Visit (HOSPITAL_COMMUNITY): Payer: Self-pay

## 2021-10-06 MED ORDER — ALPRAZOLAM 0.5 MG PO TABS
ORAL_TABLET | ORAL | 0 refills | Status: DC
  Filled 2021-10-06: qty 30, 30d supply, fill #0

## 2021-10-23 DIAGNOSIS — I10 Essential (primary) hypertension: Secondary | ICD-10-CM | POA: Diagnosis not present

## 2021-10-23 DIAGNOSIS — R7301 Impaired fasting glucose: Secondary | ICD-10-CM | POA: Diagnosis not present

## 2021-10-28 ENCOUNTER — Other Ambulatory Visit (HOSPITAL_COMMUNITY): Payer: Self-pay

## 2021-10-29 ENCOUNTER — Other Ambulatory Visit: Payer: Self-pay | Admitting: Internal Medicine

## 2021-10-29 ENCOUNTER — Other Ambulatory Visit (HOSPITAL_COMMUNITY): Payer: Self-pay

## 2021-10-29 DIAGNOSIS — E785 Hyperlipidemia, unspecified: Secondary | ICD-10-CM

## 2021-10-29 DIAGNOSIS — R82998 Other abnormal findings in urine: Secondary | ICD-10-CM | POA: Diagnosis not present

## 2021-10-29 DIAGNOSIS — Z79899 Other long term (current) drug therapy: Secondary | ICD-10-CM | POA: Diagnosis not present

## 2021-10-29 DIAGNOSIS — Z0000 Encounter for general adult medical examination without abnormal findings: Secondary | ICD-10-CM | POA: Diagnosis not present

## 2021-10-29 DIAGNOSIS — Z1331 Encounter for screening for depression: Secondary | ICD-10-CM | POA: Diagnosis not present

## 2021-10-29 MED ORDER — BELSOMRA 20 MG PO TABS
ORAL_TABLET | ORAL | 5 refills | Status: DC
  Filled 2021-10-29: qty 30, 30d supply, fill #0

## 2021-10-30 ENCOUNTER — Other Ambulatory Visit (HOSPITAL_COMMUNITY): Payer: Self-pay

## 2021-10-31 ENCOUNTER — Other Ambulatory Visit (HOSPITAL_COMMUNITY): Payer: Self-pay

## 2021-11-03 ENCOUNTER — Other Ambulatory Visit (HOSPITAL_COMMUNITY): Payer: Self-pay

## 2021-11-04 ENCOUNTER — Other Ambulatory Visit (HOSPITAL_COMMUNITY): Payer: Self-pay

## 2021-11-07 ENCOUNTER — Other Ambulatory Visit (HOSPITAL_COMMUNITY): Payer: Self-pay

## 2021-11-07 MED ORDER — RAMELTEON 8 MG PO TABS
ORAL_TABLET | ORAL | 11 refills | Status: DC
  Filled 2021-11-07: qty 30, 30d supply, fill #0
  Filled 2021-12-09: qty 30, 30d supply, fill #1
  Filled 2022-01-05: qty 30, 30d supply, fill #2
  Filled 2022-02-16: qty 30, 30d supply, fill #3
  Filled 2022-03-20: qty 30, 30d supply, fill #4
  Filled 2022-05-19: qty 30, 30d supply, fill #5
  Filled 2022-06-17: qty 30, 30d supply, fill #6
  Filled 2022-07-20: qty 30, 30d supply, fill #7
  Filled 2022-08-24: qty 30, 30d supply, fill #8
  Filled 2022-09-24: qty 30, 30d supply, fill #9
  Filled 2022-11-02 – 2022-11-03 (×2): qty 30, 30d supply, fill #10

## 2021-11-10 ENCOUNTER — Other Ambulatory Visit (HOSPITAL_COMMUNITY): Payer: Self-pay

## 2021-11-11 ENCOUNTER — Ambulatory Visit
Admission: RE | Admit: 2021-11-11 | Discharge: 2021-11-11 | Disposition: A | Discharge status: Home / Self Care | Payer: BC Managed Care – PPO | Source: Ambulatory Visit | Attending: Internal Medicine | Admitting: Internal Medicine

## 2021-11-11 DIAGNOSIS — E785 Hyperlipidemia, unspecified: Secondary | ICD-10-CM

## 2021-11-11 DIAGNOSIS — E7800 Pure hypercholesterolemia, unspecified: Secondary | ICD-10-CM | POA: Diagnosis not present

## 2021-11-12 DIAGNOSIS — Z23 Encounter for immunization: Secondary | ICD-10-CM | POA: Diagnosis not present

## 2021-11-19 ENCOUNTER — Other Ambulatory Visit (HOSPITAL_COMMUNITY): Payer: Self-pay

## 2021-11-20 ENCOUNTER — Other Ambulatory Visit (HOSPITAL_COMMUNITY): Payer: Self-pay

## 2021-11-27 ENCOUNTER — Other Ambulatory Visit (HOSPITAL_COMMUNITY): Payer: Self-pay

## 2021-12-09 ENCOUNTER — Other Ambulatory Visit (HOSPITAL_COMMUNITY): Payer: Self-pay

## 2021-12-09 DIAGNOSIS — Z23 Encounter for immunization: Secondary | ICD-10-CM | POA: Diagnosis not present

## 2021-12-10 ENCOUNTER — Other Ambulatory Visit (HOSPITAL_COMMUNITY): Payer: Self-pay

## 2021-12-17 IMAGING — MG MM DIGITAL SCREENING BILAT W/ TOMO AND CAD
8 series · 8 of 24 positions shown · non-contrast
Comparison: Previous exam(s).

CLINICAL DATA: Screening.

EXAM:
DIGITAL SCREENING BILATERAL MAMMOGRAM WITH TOMOSYNTHESIS AND CAD
TECHNIQUE: Bilateral screening digital craniocaudal and mediolateral oblique
mammograms were obtained. Bilateral screening digital breast
tomosynthesis was performed. The images were evaluated with
computer-aided detection.

[R MLO synth-2D]
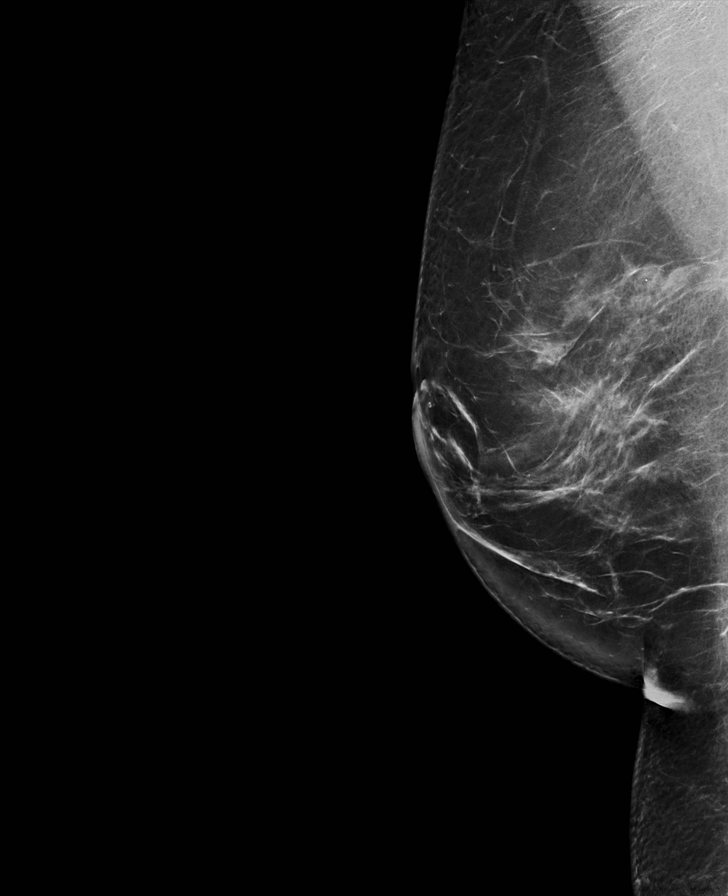

[L MLO synth-2D]
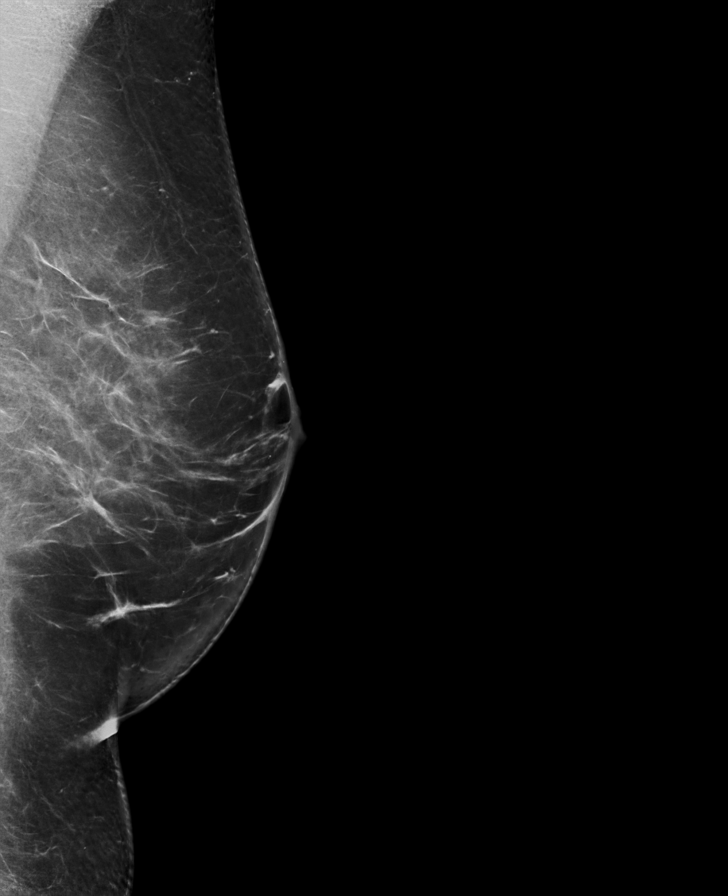

[R CC synth-2D]
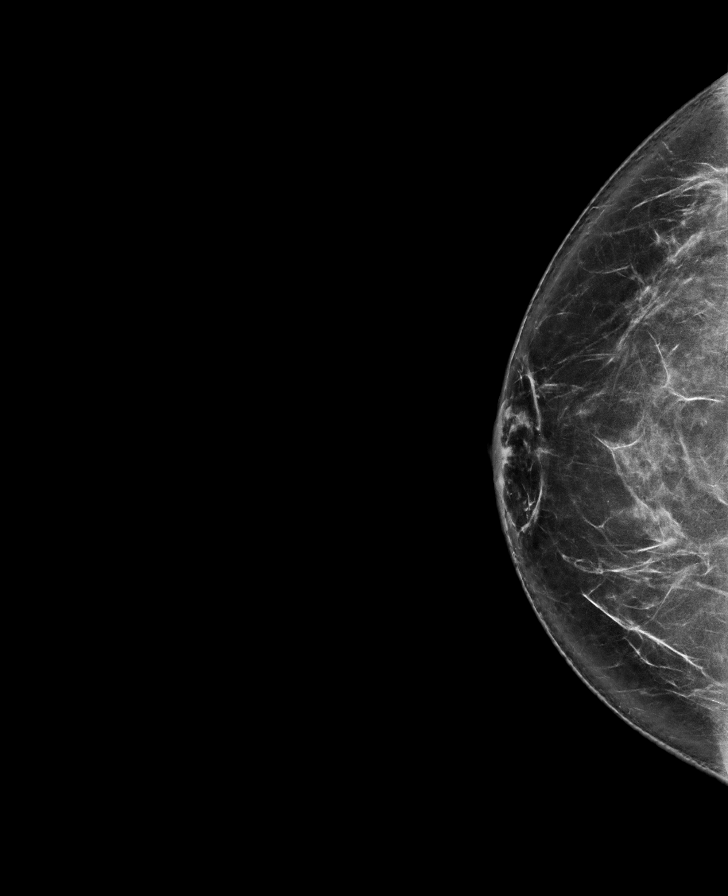

[L CC synth-2D]
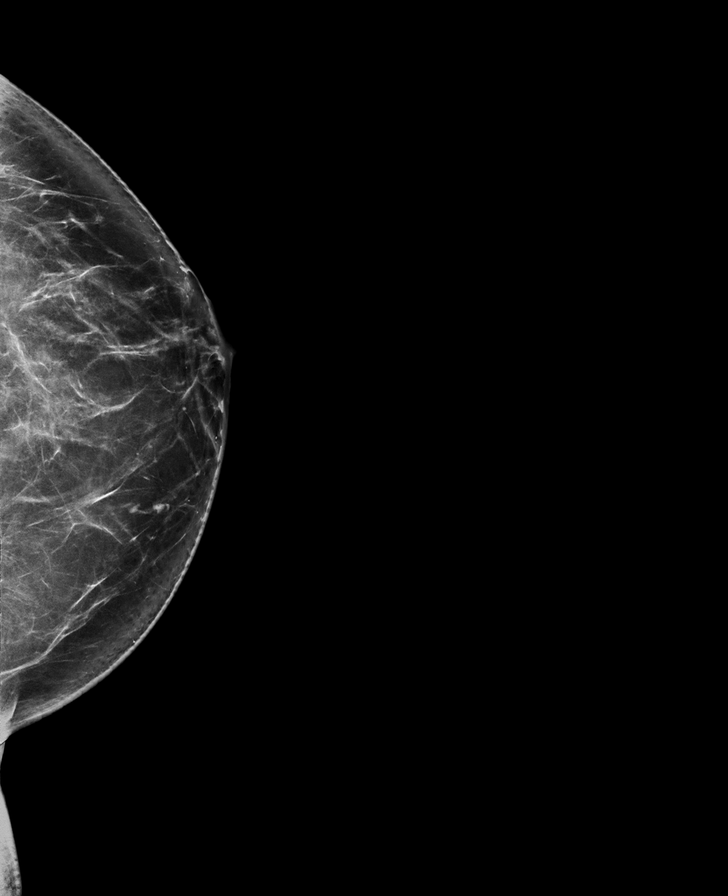

[R CC tomo · tomo slice 44/87.0]
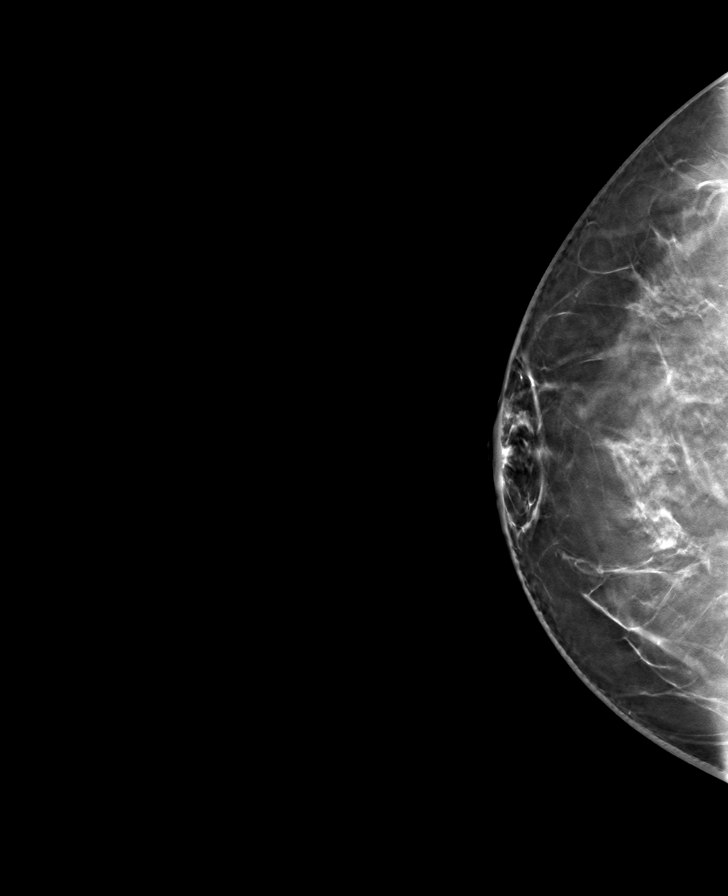

[L MLO tomo · tomo slice 49/98.0]
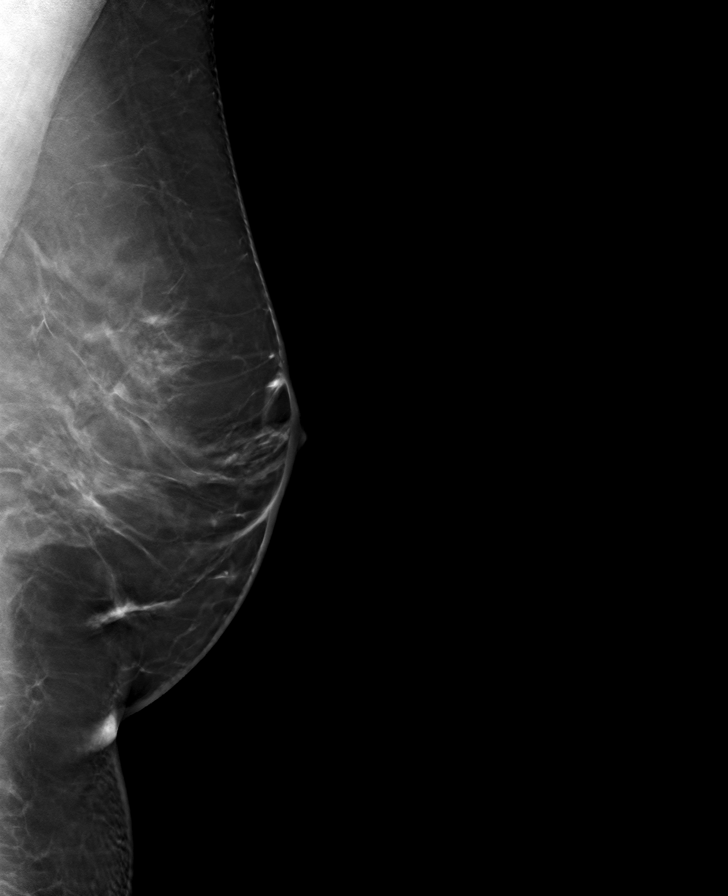

[L CC tomo · tomo slice 42/83.0]
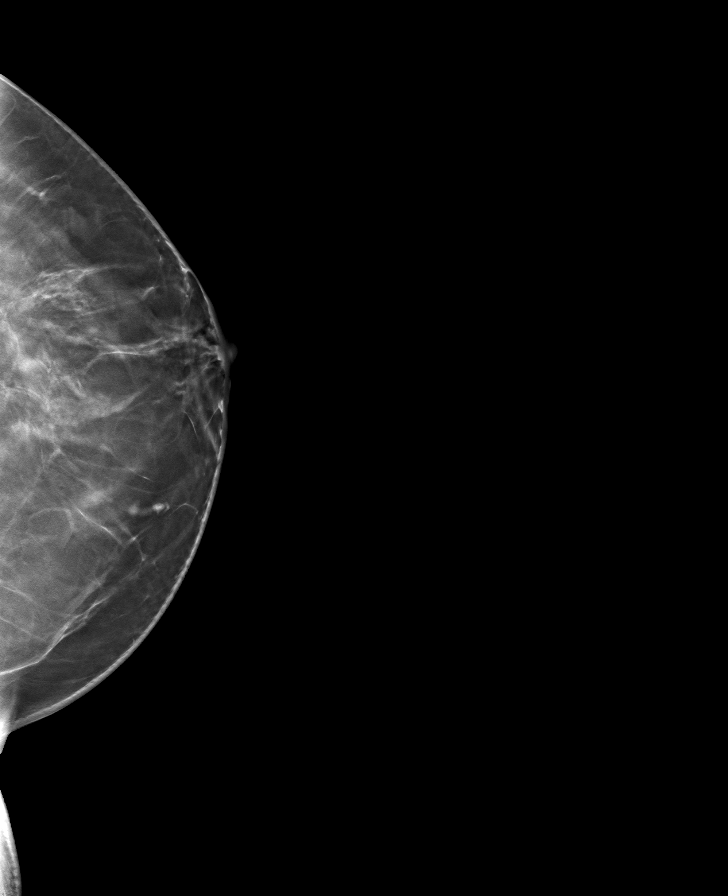

[R MLO tomo · tomo slice 49/98.0]
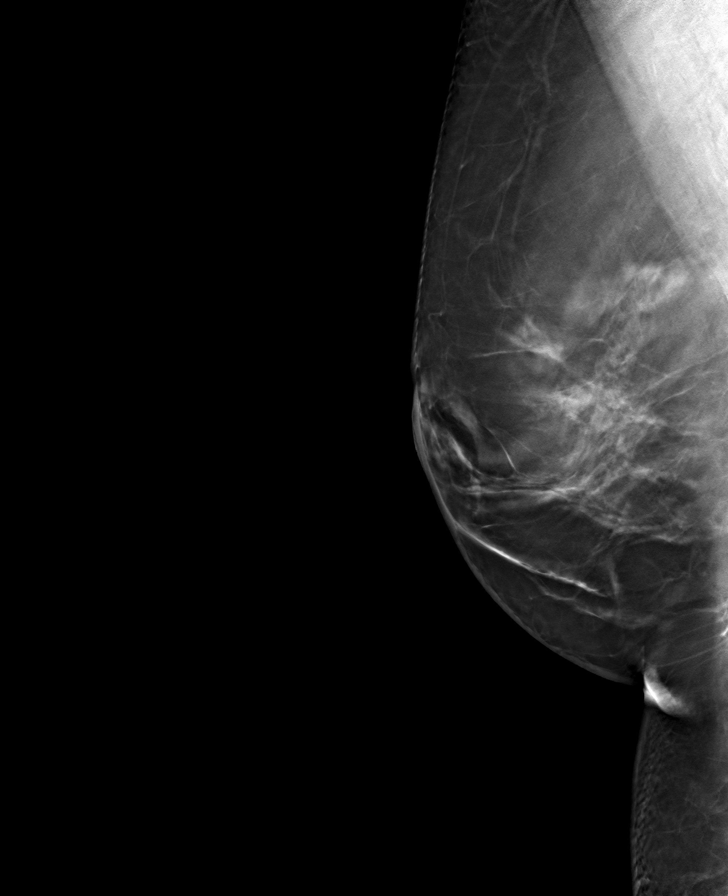

[8 of 24 positions shown; findings below may reference images not displayed]

ACR Breast Density Category b: There are scattered areas of
fibroglandular density.
FINDINGS: There are no findings suspicious for malignancy. The images were
evaluated with computer-aided detection.
IMPRESSION: No mammographic evidence of malignancy. A result letter of this
screening mammogram will be mailed directly to the patient.

RECOMMENDATION:
Screening mammogram in one year. (Code:[id number]-[id number])

BI-RADS CATEGORY  1: Negative.

## 2021-12-24 DIAGNOSIS — H5213 Myopia, bilateral: Secondary | ICD-10-CM | POA: Diagnosis not present

## 2021-12-24 DIAGNOSIS — H04123 Dry eye syndrome of bilateral lacrimal glands: Secondary | ICD-10-CM | POA: Diagnosis not present

## 2021-12-24 DIAGNOSIS — H25813 Combined forms of age-related cataract, bilateral: Secondary | ICD-10-CM | POA: Diagnosis not present

## 2021-12-24 DIAGNOSIS — H43813 Vitreous degeneration, bilateral: Secondary | ICD-10-CM | POA: Diagnosis not present

## 2021-12-29 ENCOUNTER — Other Ambulatory Visit (HOSPITAL_COMMUNITY): Payer: Self-pay

## 2022-01-01 DIAGNOSIS — Z23 Encounter for immunization: Secondary | ICD-10-CM | POA: Diagnosis not present

## 2022-01-05 ENCOUNTER — Other Ambulatory Visit (HOSPITAL_COMMUNITY): Payer: Self-pay

## 2022-01-06 ENCOUNTER — Other Ambulatory Visit (HOSPITAL_COMMUNITY): Payer: Self-pay

## 2022-01-12 ENCOUNTER — Other Ambulatory Visit (HOSPITAL_COMMUNITY): Payer: Self-pay

## 2022-01-12 ENCOUNTER — Other Ambulatory Visit: Payer: Self-pay | Admitting: Obstetrics & Gynecology

## 2022-01-21 DIAGNOSIS — N281 Cyst of kidney, acquired: Secondary | ICD-10-CM | POA: Diagnosis not present

## 2022-01-23 ENCOUNTER — Other Ambulatory Visit (HOSPITAL_COMMUNITY): Payer: Self-pay

## 2022-01-26 ENCOUNTER — Other Ambulatory Visit (HOSPITAL_COMMUNITY): Payer: Self-pay

## 2022-02-02 ENCOUNTER — Other Ambulatory Visit (HOSPITAL_COMMUNITY): Payer: Self-pay

## 2022-02-16 ENCOUNTER — Other Ambulatory Visit: Payer: Self-pay | Admitting: Obstetrics & Gynecology

## 2022-02-16 ENCOUNTER — Other Ambulatory Visit (HOSPITAL_COMMUNITY): Payer: Self-pay

## 2022-02-16 MED ORDER — ATORVASTATIN CALCIUM 10 MG PO TABS
10.0000 mg | ORAL_TABLET | Freq: Every day | ORAL | 3 refills | Status: DC
  Filled 2022-02-16: qty 90, 90d supply, fill #0
  Filled 2022-05-19: qty 90, 90d supply, fill #1
  Filled 2022-08-24: qty 90, 90d supply, fill #2
  Filled 2022-11-02 – 2022-11-03 (×2): qty 90, 90d supply, fill #3

## 2022-02-17 ENCOUNTER — Other Ambulatory Visit (HOSPITAL_COMMUNITY): Payer: Self-pay

## 2022-02-18 ENCOUNTER — Other Ambulatory Visit (HOSPITAL_COMMUNITY): Payer: Self-pay

## 2022-02-18 ENCOUNTER — Other Ambulatory Visit: Payer: Self-pay

## 2022-02-18 DIAGNOSIS — F419 Anxiety disorder, unspecified: Secondary | ICD-10-CM

## 2022-02-18 DIAGNOSIS — E669 Obesity, unspecified: Secondary | ICD-10-CM

## 2022-02-18 MED ORDER — ESCITALOPRAM OXALATE 20 MG PO TABS
20.0000 mg | ORAL_TABLET | Freq: Every day | ORAL | 0 refills | Status: DC
  Filled 2022-02-18 (×2): qty 30, 30d supply, fill #0

## 2022-02-18 MED ORDER — WEGOVY 2.4 MG/0.75ML ~~LOC~~ SOAJ
2.4000 mg | SUBCUTANEOUS | 1 refills | Status: DC
  Filled 2022-02-18 (×2): qty 3, 28d supply, fill #0
  Filled 2022-03-20 – 2022-03-23 (×2): qty 3, 28d supply, fill #1

## 2022-02-23 ENCOUNTER — Other Ambulatory Visit (HOSPITAL_COMMUNITY): Payer: Self-pay

## 2022-03-20 ENCOUNTER — Other Ambulatory Visit (HOSPITAL_COMMUNITY): Payer: Self-pay

## 2022-03-20 ENCOUNTER — Other Ambulatory Visit: Payer: Self-pay | Admitting: Obstetrics & Gynecology

## 2022-03-20 DIAGNOSIS — F419 Anxiety disorder, unspecified: Secondary | ICD-10-CM

## 2022-03-23 ENCOUNTER — Other Ambulatory Visit (HOSPITAL_COMMUNITY): Payer: Self-pay

## 2022-03-23 ENCOUNTER — Other Ambulatory Visit: Payer: Self-pay

## 2022-03-27 ENCOUNTER — Other Ambulatory Visit (HOSPITAL_COMMUNITY): Payer: Self-pay

## 2022-04-17 ENCOUNTER — Other Ambulatory Visit (HOSPITAL_COMMUNITY): Payer: Self-pay

## 2022-04-17 ENCOUNTER — Other Ambulatory Visit: Payer: Self-pay | Admitting: Obstetrics & Gynecology

## 2022-04-17 DIAGNOSIS — E669 Obesity, unspecified: Secondary | ICD-10-CM

## 2022-04-20 ENCOUNTER — Other Ambulatory Visit (HOSPITAL_COMMUNITY): Payer: Self-pay

## 2022-04-20 ENCOUNTER — Other Ambulatory Visit: Payer: Self-pay

## 2022-04-20 ENCOUNTER — Telehealth (INDEPENDENT_AMBULATORY_CARE_PROVIDER_SITE_OTHER): Payer: BC Managed Care – PPO | Admitting: Obstetrics & Gynecology

## 2022-04-20 ENCOUNTER — Telehealth: Payer: BC Managed Care – PPO | Admitting: Obstetrics & Gynecology

## 2022-04-20 DIAGNOSIS — N911 Secondary amenorrhea: Secondary | ICD-10-CM | POA: Diagnosis not present

## 2022-04-20 DIAGNOSIS — E669 Obesity, unspecified: Secondary | ICD-10-CM

## 2022-04-20 MED ORDER — WEGOVY 2.4 MG/0.75ML ~~LOC~~ SOAJ
2.4000 mg | SUBCUTANEOUS | 1 refills | Status: DC
  Filled 2022-04-20: qty 3, 28d supply, fill #0
  Filled 2022-05-19: qty 3, 28d supply, fill #1

## 2022-04-20 NOTE — Progress Notes (Signed)
Patient ID: Ashley Edwards, female   DOB: September 22, 1970, 52 y.o.   MRN: [id number]     GYNECOLOGY VIRTUAL VISIT ENCOUNTER NOTE  Provider location: [facility] [place]   Patient location: Home  I connected with Ashley Edwards on 04/20/22 at 11:10 AM EST by MyChart Video Encounter and verified that I am speaking with the correct person using two identifiers.   I discussed the limitations, risks, security and privacy concerns of performing an evaluation and management service virtually and the availability of in person appointments. I also discussed with the patient that there may be a patient responsible charge related to this service. The patient expressed understanding and agreed to proceed.   History:  LOMA PFISTERER is a 52 y.o. [phone] female being evaluated today for weight loss. She denies any abnormal vaginal discharge, bleeding, pelvic pain or other concerns.   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 and has large muscle mass  No menses since our last visit (had ablation 15 years)--now no menses for several months   Past Medical History:  Diagnosis Date   Chronic kidney disease    Endometriosis    History of kidney stones    Hyperlipemia    Hypertension    Kidney stones    Microalbuminuria    PONV (postoperative nausea and vomiting)    Past Surgical History:  Procedure Laterality Date   BREAST REDUCTION SURGERY  2016   CERVICAL DISCECTOMY     DIAGNOSTIC LAPAROSCOPY Left    left ovary removed   DILATION AND CURETTAGE OF UTERUS     EXTRACORPOREAL SHOCK WAVE LITHOTRIPSY Left 09/02/2017   Procedure: LEFT EXTRACORPOREAL SHOCK WAVE LITHOTRIPSY (ESWL);  Surgeon: Festus Aloe, MD;  Location: [facility];  Service: Urology;  Laterality: Left;   EXTRACORPOREAL SHOCK WAVE LITHOTRIPSY Left 06/26/2019   Procedure:  EXTRACORPOREAL SHOCK WAVE LITHOTRIPSY (ESWL);  Surgeon: Festus Aloe, MD;  Location: [facility];  Service: Urology;  Laterality: Left;   EXTRACORPOREAL SHOCK WAVE LITHOTRIPSY Right 05/02/2020   Procedure: EXTRACORPOREAL SHOCK WAVE LITHOTRIPSY (ESWL);  Surgeon: Ardis Hughs, MD;  Location: [facility];  Service: Urology;  Laterality: Right;   REDUCTION MAMMAPLASTY     The following portions of the patient's history were reviewed and updated as appropriate: allergies, current medications, past family history, past medical history, past social history, past surgical history and problem list.   Review of Systems:  Pertinent items noted in HPI and remainder of comprehensive ROS otherwise negative.  Physical Exam:   General:  Alert, oriented and cooperative. Patient appears to be in no acute distress.  Mental Status: Normal mood and affect. Normal behavior. Normal judgment and thought content.   Respiratory: Normal respiratory effort, no problems with respiration noted  Rest of physical exam deferred due to type of encounter  Labs and Imaging No results found for this or any previous visit (from the past 336 hour(s)). No results found.     Assessment and Plan:     1. Obesity, unspecified classification, unspecified obesity type, unspecified whether serious comorbidity present Refill Wegovy  2.  Secondary amenorrhea--check [place] and E2      I discussed the assessment and treatment plan with the patient. The patient was provided an opportunity to ask questions and all were answered. The patient agreed with the plan and demonstrated an understanding of the instructions.   The  patient was advised to call back or seek an in-person evaluation/go to the ED if the symptoms worsen or if the condition fails to improve as anticipated.  I provided 25 minutes during this encounter for review of records, video encounter, documentation, and coordination of care.     Silas Sacramento, MD [facility], [place]

## 2022-04-21 ENCOUNTER — Other Ambulatory Visit (HOSPITAL_COMMUNITY): Payer: Self-pay

## 2022-04-21 ENCOUNTER — Other Ambulatory Visit: Payer: Self-pay | Admitting: *Deleted

## 2022-04-21 ENCOUNTER — Other Ambulatory Visit: Payer: Self-pay | Admitting: Obstetrics & Gynecology

## 2022-04-21 DIAGNOSIS — F419 Anxiety disorder, unspecified: Secondary | ICD-10-CM

## 2022-04-21 MED ORDER — ESCITALOPRAM OXALATE 20 MG PO TABS
20.0000 mg | ORAL_TABLET | Freq: Every day | ORAL | 1 refills | Status: DC
  Filled 2022-04-21 – 2022-05-05 (×2): qty 30, 30d supply, fill #0
  Filled 2022-06-16: qty 30, 30d supply, fill #1

## 2022-04-24 ENCOUNTER — Encounter: Payer: Self-pay | Admitting: Obstetrics & Gynecology

## 2022-04-27 ENCOUNTER — Telehealth: Payer: BC Managed Care – PPO | Admitting: Obstetrics & Gynecology

## 2022-04-29 ENCOUNTER — Other Ambulatory Visit (HOSPITAL_COMMUNITY): Payer: Self-pay

## 2022-05-01 ENCOUNTER — Other Ambulatory Visit: Payer: Self-pay | Admitting: Obstetrics & Gynecology

## 2022-05-01 MED ORDER — QSYMIA 3.75-23 MG PO CP24
ORAL_CAPSULE | ORAL | 2 refills | Status: DC
Start: 2022-05-01 — End: 2023-03-22

## 2022-05-01 NOTE — Progress Notes (Signed)
Creatinine is 0.8.  Will check BP and heart rate at home and in office

## 2022-05-05 ENCOUNTER — Other Ambulatory Visit (HOSPITAL_COMMUNITY): Payer: Self-pay

## 2022-05-07 ENCOUNTER — Other Ambulatory Visit: Payer: Self-pay | Admitting: Obstetrics & Gynecology

## 2022-05-07 ENCOUNTER — Other Ambulatory Visit (HOSPITAL_COMMUNITY): Payer: Self-pay

## 2022-05-07 DIAGNOSIS — Z1231 Encounter for screening mammogram for malignant neoplasm of breast: Secondary | ICD-10-CM

## 2022-05-19 ENCOUNTER — Other Ambulatory Visit (HOSPITAL_COMMUNITY): Payer: Self-pay

## 2022-05-19 MED ORDER — DILTIAZEM HCL ER COATED BEADS 120 MG PO CP24
120.0000 mg | ORAL_CAPSULE | Freq: Every evening | ORAL | 4 refills | Status: DC
  Filled 2022-05-19: qty 90, 90d supply, fill #0
  Filled 2022-08-24: qty 90, 90d supply, fill #1
  Filled 2022-11-02 – 2022-11-03 (×2): qty 90, 90d supply, fill #2
  Filled 2023-02-27: qty 30, 30d supply, fill #3
  Filled 2023-03-28: qty 30, 30d supply, fill #4
  Filled 2023-05-04: qty 30, 30d supply, fill #5

## 2022-05-20 ENCOUNTER — Other Ambulatory Visit (HOSPITAL_COMMUNITY): Payer: Self-pay

## 2022-06-16 ENCOUNTER — Other Ambulatory Visit (HOSPITAL_COMMUNITY): Payer: Self-pay

## 2022-06-16 ENCOUNTER — Other Ambulatory Visit: Payer: Self-pay | Admitting: Obstetrics & Gynecology

## 2022-06-16 DIAGNOSIS — E669 Obesity, unspecified: Secondary | ICD-10-CM

## 2022-06-16 MED ORDER — TELMISARTAN 20 MG PO TABS
20.0000 mg | ORAL_TABLET | Freq: Every evening | ORAL | 4 refills | Status: DC
  Filled 2022-06-16: qty 90, 90d supply, fill #0
  Filled 2022-09-24: qty 90, 90d supply, fill #1
  Filled 2022-11-02 – 2022-12-28 (×2): qty 90, 90d supply, fill #2
  Filled 2023-03-28: qty 30, 30d supply, fill #3
  Filled 2023-05-04: qty 30, 30d supply, fill #4
  Filled 2023-05-24: qty 30, 30d supply, fill #5

## 2022-06-17 ENCOUNTER — Other Ambulatory Visit: Payer: Self-pay

## 2022-06-17 ENCOUNTER — Other Ambulatory Visit (HOSPITAL_COMMUNITY): Payer: Self-pay

## 2022-06-17 ENCOUNTER — Other Ambulatory Visit: Payer: Self-pay | Admitting: *Deleted

## 2022-06-17 DIAGNOSIS — E669 Obesity, unspecified: Secondary | ICD-10-CM

## 2022-06-17 MED ORDER — WEGOVY 2.4 MG/0.75ML ~~LOC~~ SOAJ
2.4000 mg | SUBCUTANEOUS | 1 refills | Status: DC
  Filled 2022-06-17: qty 3, 28d supply, fill #0
  Filled 2022-07-20: qty 3, 28d supply, fill #1

## 2022-06-24 NOTE — Progress Notes (Deleted)
Last Mammogram: 06/05/21- negative- Has next mammo scheudled Last Pap Smear:  05/12/21- negative Last Colon Screening;  *** Seat Belts:   yes Sun Screen:   yes Dental Check Up:  yes Brush & Floss:  yes

## 2022-06-29 ENCOUNTER — Ambulatory Visit: Payer: BC Managed Care – PPO | Admitting: Obstetrics & Gynecology

## 2022-07-01 ENCOUNTER — Ambulatory Visit (INDEPENDENT_AMBULATORY_CARE_PROVIDER_SITE_OTHER): Payer: BC Managed Care – PPO

## 2022-07-01 DIAGNOSIS — Z1231 Encounter for screening mammogram for malignant neoplasm of breast: Secondary | ICD-10-CM | POA: Diagnosis not present

## 2022-07-02 NOTE — Progress Notes (Signed)
Last Mammogram: 07/01/22- Last Pap Smear:  04/22/21- neg Last Colon Screening;  2022 Seat Belts:   yes Sun Screen:   yes Dental Check Up:  yes Brush & Floss:  yes

## 2022-07-06 ENCOUNTER — Ambulatory Visit (INDEPENDENT_AMBULATORY_CARE_PROVIDER_SITE_OTHER): Payer: BC Managed Care – PPO | Admitting: Obstetrics & Gynecology

## 2022-07-06 ENCOUNTER — Encounter: Payer: Self-pay | Admitting: Obstetrics & Gynecology

## 2022-07-06 VITALS — BP 144/88 | HR 86 | Resp 16 | Ht 67.0 in | Wt 174.0 lb

## 2022-07-06 DIAGNOSIS — N951 Menopausal and female climacteric states: Secondary | ICD-10-CM

## 2022-07-06 DIAGNOSIS — Z01419 Encounter for gynecological examination (general) (routine) without abnormal findings: Secondary | ICD-10-CM

## 2022-07-06 NOTE — Progress Notes (Signed)
Subjective:     Ashley Edwards is a 52 y.o. female here for a routine exam.  Current complaints: plateau on Wegovy; considering phenteramine.  One episode spotting in Feb.     Gynecologic History No LMP recorded. (Menstrual status: Perimenopausal). Contraception: post menopausal status Last Mammogram: 07/01/22- Last Pap Smear:  04/22/21- neg Last Colon Screening;  2022 Seat Belts:   yes Sun Screen:   yes Dental Check Up:  yes Brush & Floss:  yes     Obstetric History OB History  Gravida Para Term Preterm AB Living  SAB IAB Ectopic Multiple Live Births  1            # Outcome Date GA Lbr Len/2nd Weight Sex Delivery Anes PTL Lv  4 SAB           3 Term           2 Term           1 Term              The following portions of the patient's history were reviewed and updated as appropriate: allergies, current medications, past family history, past medical history, past social history, past surgical history, and problem list.  Review of Systems Pertinent items noted in HPI and remainder of comprehensive ROS otherwise negative.    Objective:     Vitals:   07/06/22 1545  BP: (!) 144/88  Pulse: 86  Resp: 16  Weight: 78.9 kg  Height:  (1.702 m)   Vitals:  WNL General appearance: alert, cooperative and no distress  HEENT: Normocephalic, without obvious abnormality, atraumatic Eyes: negative Throat: lips, mucosa, and tongue normal; teeth and gums normal  Respiratory: Clear to auscultation bilaterally  CV: Regular rate and rhythm  Breasts:  Normal appearance, no masses or tenderness, no nipple retraction or dimpling  GI: Soft, non-tender; bowel sounds normal; no masses,  no organomegaly  GU: External Genitalia:  Tanner V, no lesion Urethra:  No prolapse   Vagina: Pink, normal rugae, no blood or discharge  Cervix: No CMT, cervix unchanged from last year (biopsy negative);   Uterus:  Normal size and contour, non tender  Adnexa: Normal, no masses, non  tender  Musculoskeletal: No edema, redness or tenderness in the calves or thighs  Skin: No lesions or rash  Lymphatic: Axillary adenopathy: none     Psychiatric: Normal mood and behavior        Assessment:    Healthy female exam.    Plan:    Pap up to date Yearly mammogram Colon cancer screening up to date Check FSH and E2 [facility] will try to ween from [facility].

## 2022-07-07 LAB — FOLLICLE STIMULATING HORMONE: FSH: 105 m[IU]/mL

## 2022-07-13 ENCOUNTER — Other Ambulatory Visit (HOSPITAL_COMMUNITY): Payer: Self-pay

## 2022-07-13 MED ORDER — ONDANSETRON HCL 4 MG PO TABS
4.0000 mg | ORAL_TABLET | Freq: Four times a day (QID) | ORAL | 1 refills | Status: DC | PRN
Start: 2022-07-12 — End: 2023-05-04
  Filled 2022-07-13 – 2022-07-14 (×2): qty 30, 8d supply, fill #0
  Filled 2023-03-07: qty 30, 8d supply, fill #1

## 2022-07-13 MED ORDER — CIPROFLOXACIN HCL 500 MG PO TABS
500.0000 mg | ORAL_TABLET | Freq: Two times a day (BID) | ORAL | 0 refills | Status: DC
  Filled 2022-07-13 – 2022-07-14 (×2): qty 28, 14d supply, fill #0

## 2022-07-13 MED ORDER — AZITHROMYCIN 250 MG PO TABS
500.0000 mg | ORAL_TABLET | Freq: Every day | ORAL | 0 refills | Status: DC
  Filled 2022-07-13 – 2022-07-14 (×2): qty 14, 7d supply, fill #0

## 2022-07-14 ENCOUNTER — Other Ambulatory Visit (HOSPITAL_COMMUNITY): Payer: Self-pay

## 2022-07-18 ENCOUNTER — Encounter: Payer: Self-pay | Admitting: Obstetrics & Gynecology

## 2022-07-20 ENCOUNTER — Other Ambulatory Visit: Payer: Self-pay | Admitting: Obstetrics & Gynecology

## 2022-07-20 ENCOUNTER — Other Ambulatory Visit (HOSPITAL_COMMUNITY): Payer: Self-pay

## 2022-07-20 DIAGNOSIS — F419 Anxiety disorder, unspecified: Secondary | ICD-10-CM

## 2022-07-20 DIAGNOSIS — M542 Cervicalgia: Secondary | ICD-10-CM | POA: Diagnosis not present

## 2022-07-20 MED ORDER — DICLOFENAC SODIUM 75 MG PO TBEC
75.0000 mg | DELAYED_RELEASE_TABLET | Freq: Two times a day (BID) | ORAL | 1 refills | Status: DC
  Filled 2022-07-20: qty 60, 30d supply, fill #0
  Filled 2022-08-24: qty 60, 30d supply, fill #1

## 2022-07-20 MED ORDER — TIZANIDINE HCL 2 MG PO TABS
2.0000 mg | ORAL_TABLET | Freq: Two times a day (BID) | ORAL | 1 refills | Status: DC
  Filled 2022-07-20: qty 40, 10d supply, fill #0
  Filled 2022-12-28: qty 40, 10d supply, fill #1

## 2022-07-20 MED ORDER — PREDNISONE 5 MG (48) PO TBPK
ORAL_TABLET | ORAL | 0 refills | Status: DC
  Filled 2022-07-20: qty 48, 12d supply, fill #0

## 2022-07-30 ENCOUNTER — Other Ambulatory Visit: Payer: Self-pay | Admitting: Obstetrics & Gynecology

## 2022-07-30 ENCOUNTER — Encounter: Payer: Self-pay | Admitting: Obstetrics & Gynecology

## 2022-07-30 ENCOUNTER — Other Ambulatory Visit (HOSPITAL_COMMUNITY): Payer: Self-pay

## 2022-07-30 DIAGNOSIS — F419 Anxiety disorder, unspecified: Secondary | ICD-10-CM

## 2022-07-30 MED ORDER — ESCITALOPRAM OXALATE 20 MG PO TABS
20.0000 mg | ORAL_TABLET | Freq: Every day | ORAL | 12 refills | Status: DC
  Filled 2022-07-30: qty 30, 30d supply, fill #0
  Filled 2022-08-24: qty 30, 30d supply, fill #1
  Filled 2022-09-24: qty 30, 30d supply, fill #2
  Filled 2022-11-02 – 2022-11-03 (×2): qty 30, 30d supply, fill #3
  Filled 2022-12-02: qty 30, 30d supply, fill #4
  Filled 2022-12-28: qty 30, 30d supply, fill #5
  Filled 2023-02-08: qty 30, 30d supply, fill #6
  Filled 2023-03-07: qty 30, 30d supply, fill #7
  Filled 2023-04-05: qty 30, 30d supply, fill #8
  Filled 2023-05-24: qty 30, 30d supply, fill #9
  Filled 2023-06-28: qty 30, 30d supply, fill #10

## 2022-07-31 ENCOUNTER — Other Ambulatory Visit (HOSPITAL_COMMUNITY): Payer: Self-pay

## 2022-07-31 DIAGNOSIS — M545 Low back pain, unspecified: Secondary | ICD-10-CM | POA: Diagnosis not present

## 2022-07-31 DIAGNOSIS — M25522 Pain in left elbow: Secondary | ICD-10-CM | POA: Diagnosis not present

## 2022-07-31 DIAGNOSIS — M542 Cervicalgia: Secondary | ICD-10-CM | POA: Diagnosis not present

## 2022-07-31 DIAGNOSIS — M25521 Pain in right elbow: Secondary | ICD-10-CM | POA: Diagnosis not present

## 2022-08-04 DIAGNOSIS — M542 Cervicalgia: Secondary | ICD-10-CM | POA: Diagnosis not present

## 2022-08-04 DIAGNOSIS — M545 Low back pain, unspecified: Secondary | ICD-10-CM | POA: Diagnosis not present

## 2022-08-04 DIAGNOSIS — M25521 Pain in right elbow: Secondary | ICD-10-CM | POA: Diagnosis not present

## 2022-08-04 DIAGNOSIS — M25522 Pain in left elbow: Secondary | ICD-10-CM | POA: Diagnosis not present

## 2022-08-24 ENCOUNTER — Other Ambulatory Visit: Payer: Self-pay | Admitting: Obstetrics & Gynecology

## 2022-08-24 ENCOUNTER — Other Ambulatory Visit (HOSPITAL_COMMUNITY): Payer: Self-pay

## 2022-08-24 DIAGNOSIS — E669 Obesity, unspecified: Secondary | ICD-10-CM

## 2022-08-24 DIAGNOSIS — D2262 Melanocytic nevi of left upper limb, including shoulder: Secondary | ICD-10-CM | POA: Diagnosis not present

## 2022-08-24 DIAGNOSIS — L819 Disorder of pigmentation, unspecified: Secondary | ICD-10-CM | POA: Diagnosis not present

## 2022-08-24 DIAGNOSIS — D225 Melanocytic nevi of trunk: Secondary | ICD-10-CM | POA: Diagnosis not present

## 2022-08-24 DIAGNOSIS — D2371 Other benign neoplasm of skin of right lower limb, including hip: Secondary | ICD-10-CM | POA: Diagnosis not present

## 2022-08-26 ENCOUNTER — Other Ambulatory Visit (HOSPITAL_COMMUNITY): Payer: Self-pay

## 2022-09-01 ENCOUNTER — Other Ambulatory Visit (HOSPITAL_COMMUNITY): Payer: Self-pay

## 2022-09-02 ENCOUNTER — Other Ambulatory Visit (HOSPITAL_COMMUNITY): Payer: Self-pay

## 2022-09-03 ENCOUNTER — Other Ambulatory Visit: Payer: Self-pay | Admitting: Obstetrics & Gynecology

## 2022-09-03 ENCOUNTER — Other Ambulatory Visit (HOSPITAL_COMMUNITY): Payer: Self-pay

## 2022-09-03 DIAGNOSIS — E669 Obesity, unspecified: Secondary | ICD-10-CM

## 2022-09-07 ENCOUNTER — Other Ambulatory Visit (HOSPITAL_COMMUNITY): Payer: Self-pay

## 2022-09-07 ENCOUNTER — Other Ambulatory Visit: Payer: Self-pay | Admitting: *Deleted

## 2022-09-07 DIAGNOSIS — E669 Obesity, unspecified: Secondary | ICD-10-CM

## 2022-09-07 MED ORDER — WEGOVY 2.4 MG/0.75ML ~~LOC~~ SOAJ
2.4000 mg | SUBCUTANEOUS | 1 refills | Status: DC
  Filled 2022-09-07: qty 3, 28d supply, fill #0
  Filled 2022-10-15: qty 3, 28d supply, fill #1

## 2022-09-24 ENCOUNTER — Other Ambulatory Visit (HOSPITAL_COMMUNITY): Payer: Self-pay

## 2022-10-01 ENCOUNTER — Other Ambulatory Visit (HOSPITAL_COMMUNITY): Payer: Self-pay

## 2022-10-15 ENCOUNTER — Other Ambulatory Visit (HOSPITAL_COMMUNITY): Payer: Self-pay

## 2022-10-19 ENCOUNTER — Other Ambulatory Visit (HOSPITAL_COMMUNITY): Payer: Self-pay

## 2022-11-02 ENCOUNTER — Other Ambulatory Visit (HOSPITAL_COMMUNITY): Payer: Self-pay

## 2022-11-03 ENCOUNTER — Other Ambulatory Visit (HOSPITAL_COMMUNITY): Payer: Self-pay

## 2022-11-03 ENCOUNTER — Other Ambulatory Visit: Payer: Self-pay

## 2022-11-17 ENCOUNTER — Other Ambulatory Visit: Payer: Self-pay | Admitting: Obstetrics & Gynecology

## 2022-11-17 ENCOUNTER — Other Ambulatory Visit (HOSPITAL_COMMUNITY): Payer: Self-pay

## 2022-11-17 DIAGNOSIS — E669 Obesity, unspecified: Secondary | ICD-10-CM

## 2022-11-18 ENCOUNTER — Other Ambulatory Visit (HOSPITAL_COMMUNITY): Payer: Self-pay

## 2022-11-18 MED ORDER — TIRZEPATIDE-WEIGHT MANAGEMENT 10 MG/0.5ML ~~LOC~~ SOAJ
10.0000 mg | SUBCUTANEOUS | 0 refills | Status: DC
  Filled 2022-11-18: qty 2, 28d supply, fill #0

## 2022-11-19 ENCOUNTER — Other Ambulatory Visit (HOSPITAL_COMMUNITY): Payer: Self-pay

## 2022-11-19 MED ORDER — TIRZEPATIDE-WEIGHT MANAGEMENT 12.5 MG/0.5ML ~~LOC~~ SOAJ
12.5000 mg | SUBCUTANEOUS | 1 refills | Status: DC
  Filled 2022-12-02: qty 2, 28d supply, fill #0
  Filled 2023-01-10: qty 2, 28d supply, fill #1

## 2022-11-20 ENCOUNTER — Other Ambulatory Visit (HOSPITAL_COMMUNITY): Payer: Self-pay

## 2022-11-23 ENCOUNTER — Other Ambulatory Visit (HOSPITAL_COMMUNITY): Payer: Self-pay

## 2022-12-02 ENCOUNTER — Other Ambulatory Visit (HOSPITAL_COMMUNITY): Payer: Self-pay

## 2022-12-02 MED ORDER — RAMELTEON 8 MG PO TABS
8.0000 mg | ORAL_TABLET | Freq: Every day | ORAL | 11 refills | Status: DC
  Filled 2022-12-02: qty 30, 30d supply, fill #0
  Filled 2022-12-28: qty 30, 30d supply, fill #1
  Filled 2023-01-10 – 2023-02-08 (×2): qty 30, 30d supply, fill #2
  Filled 2023-03-07: qty 30, 30d supply, fill #3
  Filled 2023-04-05: qty 30, 30d supply, fill #4
  Filled 2023-05-24: qty 30, 30d supply, fill #5
  Filled 2023-06-28: qty 30, 30d supply, fill #6
  Filled 2023-08-03: qty 30, 30d supply, fill #7

## 2022-12-07 ENCOUNTER — Encounter: Payer: Self-pay | Admitting: Obstetrics & Gynecology

## 2022-12-09 ENCOUNTER — Other Ambulatory Visit: Payer: Self-pay

## 2022-12-09 DIAGNOSIS — R102 Pelvic and perineal pain: Secondary | ICD-10-CM

## 2022-12-09 DIAGNOSIS — N809 Endometriosis, unspecified: Secondary | ICD-10-CM

## 2022-12-09 NOTE — Progress Notes (Signed)
Pelvic CT with contrast placed per Dr.Leggett

## 2022-12-12 ENCOUNTER — Other Ambulatory Visit (HOSPITAL_COMMUNITY): Payer: Self-pay

## 2022-12-14 ENCOUNTER — Other Ambulatory Visit (HOSPITAL_COMMUNITY): Payer: Self-pay

## 2022-12-14 DIAGNOSIS — Z1212 Encounter for screening for malignant neoplasm of rectum: Secondary | ICD-10-CM | POA: Diagnosis not present

## 2022-12-14 DIAGNOSIS — R7301 Impaired fasting glucose: Secondary | ICD-10-CM | POA: Diagnosis not present

## 2022-12-14 DIAGNOSIS — Z0000 Encounter for general adult medical examination without abnormal findings: Secondary | ICD-10-CM | POA: Diagnosis not present

## 2022-12-14 DIAGNOSIS — E559 Vitamin D deficiency, unspecified: Secondary | ICD-10-CM | POA: Diagnosis not present

## 2022-12-14 DIAGNOSIS — Z0189 Encounter for other specified special examinations: Secondary | ICD-10-CM | POA: Diagnosis not present

## 2022-12-15 ENCOUNTER — Encounter: Payer: Self-pay | Admitting: Obstetrics & Gynecology

## 2022-12-16 ENCOUNTER — Other Ambulatory Visit (HOSPITAL_COMMUNITY): Payer: Self-pay

## 2022-12-18 ENCOUNTER — Other Ambulatory Visit (HOSPITAL_COMMUNITY): Payer: Self-pay

## 2022-12-21 ENCOUNTER — Encounter: Payer: Self-pay | Admitting: Gastroenterology

## 2022-12-21 DIAGNOSIS — I10 Essential (primary) hypertension: Secondary | ICD-10-CM | POA: Diagnosis not present

## 2022-12-21 DIAGNOSIS — Z23 Encounter for immunization: Secondary | ICD-10-CM | POA: Diagnosis not present

## 2022-12-21 DIAGNOSIS — Z0000 Encounter for general adult medical examination without abnormal findings: Secondary | ICD-10-CM | POA: Diagnosis not present

## 2022-12-21 DIAGNOSIS — E538 Deficiency of other specified B group vitamins: Secondary | ICD-10-CM | POA: Diagnosis not present

## 2022-12-21 DIAGNOSIS — R82998 Other abnormal findings in urine: Secondary | ICD-10-CM | POA: Diagnosis not present

## 2022-12-31 ENCOUNTER — Other Ambulatory Visit (HOSPITAL_COMMUNITY): Payer: Self-pay

## 2023-01-06 DIAGNOSIS — H43813 Vitreous degeneration, bilateral: Secondary | ICD-10-CM | POA: Diagnosis not present

## 2023-01-06 DIAGNOSIS — H25813 Combined forms of age-related cataract, bilateral: Secondary | ICD-10-CM | POA: Diagnosis not present

## 2023-01-06 DIAGNOSIS — H5213 Myopia, bilateral: Secondary | ICD-10-CM | POA: Diagnosis not present

## 2023-01-06 DIAGNOSIS — H04123 Dry eye syndrome of bilateral lacrimal glands: Secondary | ICD-10-CM | POA: Diagnosis not present

## 2023-01-06 DIAGNOSIS — H1045 Other chronic allergic conjunctivitis: Secondary | ICD-10-CM | POA: Diagnosis not present

## 2023-01-08 DIAGNOSIS — N202 Calculus of kidney with calculus of ureter: Secondary | ICD-10-CM | POA: Diagnosis not present

## 2023-01-08 DIAGNOSIS — N281 Cyst of kidney, acquired: Secondary | ICD-10-CM | POA: Diagnosis not present

## 2023-01-11 ENCOUNTER — Telehealth: Payer: BC Managed Care – PPO | Admitting: Obstetrics & Gynecology

## 2023-01-11 ENCOUNTER — Other Ambulatory Visit: Payer: Self-pay

## 2023-01-12 ENCOUNTER — Encounter: Payer: Self-pay | Admitting: Obstetrics & Gynecology

## 2023-01-14 ENCOUNTER — Other Ambulatory Visit (HOSPITAL_COMMUNITY): Payer: Self-pay

## 2023-01-18 ENCOUNTER — Encounter: Payer: Self-pay | Admitting: *Deleted

## 2023-01-18 ENCOUNTER — Encounter: Payer: Self-pay | Admitting: Obstetrics & Gynecology

## 2023-02-08 ENCOUNTER — Other Ambulatory Visit (HOSPITAL_COMMUNITY): Payer: Self-pay

## 2023-02-16 ENCOUNTER — Other Ambulatory Visit (HOSPITAL_COMMUNITY): Payer: Self-pay

## 2023-02-16 ENCOUNTER — Other Ambulatory Visit: Payer: Self-pay | Admitting: Obstetrics & Gynecology

## 2023-02-17 ENCOUNTER — Other Ambulatory Visit (HOSPITAL_COMMUNITY): Payer: Self-pay

## 2023-02-17 ENCOUNTER — Other Ambulatory Visit: Payer: Self-pay | Admitting: *Deleted

## 2023-02-17 MED ORDER — TIRZEPATIDE-WEIGHT MANAGEMENT 12.5 MG/0.5ML ~~LOC~~ SOAJ
12.5000 mg | SUBCUTANEOUS | 1 refills | Status: DC
  Filled 2023-02-17: qty 2, 28d supply, fill #0

## 2023-02-19 ENCOUNTER — Other Ambulatory Visit (HOSPITAL_COMMUNITY): Payer: Self-pay

## 2023-02-22 ENCOUNTER — Other Ambulatory Visit: Payer: Self-pay | Admitting: *Deleted

## 2023-02-22 ENCOUNTER — Other Ambulatory Visit (HOSPITAL_COMMUNITY): Payer: Self-pay

## 2023-02-22 MED ORDER — TIRZEPATIDE-WEIGHT MANAGEMENT 12.5 MG/0.5ML ~~LOC~~ SOAJ
12.5000 mg | SUBCUTANEOUS | 1 refills | Status: DC
  Filled 2023-02-22 (×2): qty 2, 28d supply, fill #0

## 2023-02-27 ENCOUNTER — Other Ambulatory Visit: Payer: Self-pay

## 2023-03-03 ENCOUNTER — Other Ambulatory Visit (HOSPITAL_COMMUNITY): Payer: Self-pay

## 2023-03-07 ENCOUNTER — Other Ambulatory Visit (HOSPITAL_COMMUNITY): Payer: Self-pay

## 2023-03-08 ENCOUNTER — Other Ambulatory Visit (HOSPITAL_COMMUNITY): Payer: Self-pay

## 2023-03-08 ENCOUNTER — Other Ambulatory Visit: Payer: Self-pay

## 2023-03-08 MED ORDER — ATORVASTATIN CALCIUM 10 MG PO TABS
10.0000 mg | ORAL_TABLET | Freq: Every day | ORAL | 3 refills | Status: DC
  Filled 2023-03-08: qty 31, 31d supply, fill #0
  Filled 2023-04-05: qty 31, 31d supply, fill #1
  Filled 2023-05-24: qty 31, 31d supply, fill #2
  Filled 2023-06-28: qty 31, 31d supply, fill #3
  Filled 2023-09-21: qty 31, 31d supply, fill #4
  Filled 2023-10-26: qty 31, 31d supply, fill #5
  Filled 2023-12-06: qty 31, 31d supply, fill #6
  Filled 2024-01-07: qty 31, 31d supply, fill #7
  Filled 2024-02-06 – 2024-02-17 (×2): qty 31, 31d supply, fill #8

## 2023-03-09 ENCOUNTER — Other Ambulatory Visit (HOSPITAL_COMMUNITY): Payer: Self-pay

## 2023-03-22 ENCOUNTER — Other Ambulatory Visit (HOSPITAL_COMMUNITY): Payer: Self-pay

## 2023-03-22 ENCOUNTER — Other Ambulatory Visit (HOSPITAL_BASED_OUTPATIENT_CLINIC_OR_DEPARTMENT_OTHER): Payer: Self-pay

## 2023-03-22 ENCOUNTER — Ambulatory Visit (AMBULATORY_SURGERY_CENTER): Payer: Self-pay

## 2023-03-22 VITALS — Ht 67.5 in | Wt 161.0 lb

## 2023-03-22 DIAGNOSIS — Z860100 Personal history of colon polyps, unspecified: Secondary | ICD-10-CM

## 2023-03-22 MED ORDER — NA SULFATE-K SULFATE-MG SULF 17.5-3.13-1.6 GM/177ML PO SOLN
1.0000 | Freq: Once | ORAL | 0 refills | Status: AC
  Filled 2023-03-22: qty 354, 1d supply, fill #0

## 2023-03-22 NOTE — Progress Notes (Signed)
 No egg or soy allergy known to patient  No issues known to pt with past sedation with any surgeries or procedures Patient denies ever being told they had issues or difficulty with intubation  No FH of Malignant Hyperthermia Pt is not on diet pills Pt is not on home 02  Pt is not on blood thinners  Pt denies issues with constipation  No A fib or A flutter Have any cardiac testing pending--no Ambulates independently Pt instructed to use [email or web address] or GoodRx for a price reduction on prep  Patient's chart reviewed by Norleen Schillings CNRA prior to previsit and patient appropriate for the [facility].  Previsit completed and red dot placed by patient's name on their procedure day (on provider's schedule).

## 2023-03-23 ENCOUNTER — Other Ambulatory Visit: Payer: Self-pay | Admitting: Obstetrics & Gynecology

## 2023-03-24 ENCOUNTER — Other Ambulatory Visit (HOSPITAL_COMMUNITY): Payer: Self-pay

## 2023-03-24 ENCOUNTER — Other Ambulatory Visit: Payer: Self-pay | Admitting: Obstetrics & Gynecology

## 2023-03-24 MED ORDER — TIRZEPATIDE-WEIGHT MANAGEMENT 10 MG/0.5ML ~~LOC~~ SOAJ
10.0000 mg | SUBCUTANEOUS | 0 refills | Status: DC
  Filled 2023-03-24: qty 2, 28d supply, fill #0

## 2023-03-25 ENCOUNTER — Other Ambulatory Visit (HOSPITAL_BASED_OUTPATIENT_CLINIC_OR_DEPARTMENT_OTHER): Payer: Self-pay

## 2023-03-25 ENCOUNTER — Other Ambulatory Visit: Payer: Self-pay

## 2023-03-26 ENCOUNTER — Other Ambulatory Visit (HOSPITAL_COMMUNITY): Payer: Self-pay

## 2023-03-26 MED ORDER — ZOSTER VAC RECOMB ADJUVANTED 50 MCG/0.5ML IM SUSR
INTRAMUSCULAR | 1 refills | Status: DC
  Filled 2023-03-26: qty 1, 30d supply, fill #0

## 2023-04-04 ENCOUNTER — Encounter: Payer: Self-pay | Admitting: Certified Registered Nurse Anesthetist

## 2023-04-05 ENCOUNTER — Other Ambulatory Visit (HOSPITAL_COMMUNITY): Payer: Self-pay

## 2023-04-08 ENCOUNTER — Encounter: Payer: Self-pay | Admitting: Gastroenterology

## 2023-04-09 ENCOUNTER — Other Ambulatory Visit (HOSPITAL_COMMUNITY): Payer: Self-pay

## 2023-04-12 ENCOUNTER — Encounter: Payer: Self-pay | Admitting: Gastroenterology

## 2023-04-12 ENCOUNTER — Ambulatory Visit: Payer: 59 | Admitting: Gastroenterology

## 2023-04-12 VITALS — BP 134/88 | HR 71 | Temp 99.0°F | Resp 8 | Ht 67.5 in | Wt 161.0 lb

## 2023-04-12 DIAGNOSIS — Z1211 Encounter for screening for malignant neoplasm of colon: Secondary | ICD-10-CM

## 2023-04-12 DIAGNOSIS — Z860101 Personal history of adenomatous and serrated colon polyps: Secondary | ICD-10-CM

## 2023-04-12 DIAGNOSIS — Z860100 Personal history of colon polyps, unspecified: Secondary | ICD-10-CM | POA: Diagnosis not present

## 2023-04-12 DIAGNOSIS — R933 Abnormal findings on diagnostic imaging of other parts of digestive tract: Secondary | ICD-10-CM

## 2023-04-12 DIAGNOSIS — K6389 Other specified diseases of intestine: Secondary | ICD-10-CM | POA: Diagnosis not present

## 2023-04-12 DIAGNOSIS — K648 Other hemorrhoids: Secondary | ICD-10-CM | POA: Diagnosis not present

## 2023-04-12 MED ORDER — SODIUM CHLORIDE 0.9 % IV SOLN: 500.0000 mL | Freq: Once | INTRAVENOUS | Status: DC

## 2023-04-12 NOTE — Progress Notes (Unsigned)
Report given to PACU, vss

## 2023-04-12 NOTE — Progress Notes (Unsigned)
Pt's states no medical or surgical changes since previsit or office visit.

## 2023-04-12 NOTE — Progress Notes (Unsigned)
0815 Left AC IV infiltrated, IV d/c'd.  IV 22 gauge placed in right AC with 2 attempts, vss

## 2023-04-12 NOTE — Progress Notes (Signed)
Called to room to assist during endoscopic procedure.  Patient ID and intended procedure confirmed with present staff. Received instructions for my participation in the procedure from the performing physician.

## 2023-04-12 NOTE — Op Note (Addendum)
[facility] Patient Name: Ashley Edwards Procedure Date: 04/12/2023 7:56 AM MRN: [id number] Endoscopist: Napoleon Form , MD, [id number] Age: 53 Referring MD:  Date of Birth: 26-Aug-1970 Gender: Female Account #: [id number] Procedure:                Colonoscopy Indications:              High risk colon cancer surveillance: Personal                            history of adenoma (10 mm or greater in size) Medicines:                Monitored Anesthesia Care Procedure:                Pre-Anesthesia Assessment:                           - Prior to the procedure, a History and Physical                            was performed, and patient medications and                            allergies were reviewed. The patient's tolerance of                            previous anesthesia was also reviewed. The risks                            and benefits of the procedure and the sedation                            options and risks were discussed with the patient.                            All questions were answered, and informed consent                            was obtained. Prior Anticoagulants: The patient has                            taken no anticoagulant or antiplatelet agents. ASA                            Grade Assessment: II - A patient with mild systemic                            disease. After reviewing the risks and benefits,                            the patient was deemed in satisfactory condition to                            undergo the procedure.  After obtaining informed consent, the colonoscope                            was passed under direct vision. Throughout the                            procedure, the patient's blood pressure, pulse, and                            oxygen saturations were monitored continuously. The                            Olympus Scope SN [id number] was introduced through the                            anus  and advanced to the the terminal ileum, with                            identification of the appendiceal orifice and IC                            valve. The colonoscopy was performed without                            difficulty. The patient tolerated the procedure                            well. The quality of the bowel preparation was                            excellent. The terminal ileum, ileocecal valve,                            appendiceal orifice, and rectum were photographed. Scope In: 8:18:53 AM Scope Out: 8:36:13 AM Scope Withdrawal Time: 0 hours 12 minutes 18 seconds  Total Procedure Duration: 0 hours 17 minutes 20 seconds  Findings:                 The perianal and digital rectal examinations were                            normal.                           The terminal ileum contained one sessile,                            non-bleeding polypoid nodule. The polypoid nodule                            was 20 mm in diameter. Biopsies were taken with a                            cold forceps for histology.  Non-bleeding internal hemorrhoids were found during                            retroflexion. The hemorrhoids were medium-sized.                           The exam was otherwise normal throughout the                            examined colon. Complications:            No immediate complications. Estimated Blood Loss:     Estimated blood loss was minimal. Impression:               - One polypoid nodule in the terminal ileum.                            Biopsied.                           - Non-bleeding internal hemorrhoids. Recommendation:           - Resume previous diet.                           - Continue present medications.                           - Await pathology results.                           - Obtain PET Dotate CT scan abd & pelvis to                            evaluate for possible small bowel                             carcinoid/Neuroendocrine tumor                           - Repeat colonoscopy in 5 years for surveillance                            for history of colon adenomatous polyps. Napoleon Form, MD 04/12/2023 8:42:43 AM This report has been signed electronically.

## 2023-04-12 NOTE — Progress Notes (Unsigned)
[facility] History and Physical   Primary Care Physician:  Rodrigo Ran, MD   Reason for Procedure:  History of adenomatous colon polyps  Plan:    Surveillance colonoscopy with possible interventions as needed     HPI: Ashley Edwards is a very pleasant 53 y.o. female here for surveillance colonoscopy. Denies any nausea, vomiting, abdominal pain, melena or bright red blood per rectum  The risks and benefits as well as alternatives of endoscopic procedure(s) have been discussed and reviewed. All questions answered. The patient agrees to proceed.    Past Medical History:  Diagnosis Date   Endometriosis    History of kidney stones    Hyperlipemia    Hypertension    Kidney stones    Microalbuminuria    PONV (postoperative nausea and vomiting)     Past Surgical History:  Procedure Laterality Date   BREAST REDUCTION SURGERY  2016   CERVICAL DISCECTOMY     COLONOSCOPY     DIAGNOSTIC LAPAROSCOPY Left    left ovary removed   DILATION AND CURETTAGE OF UTERUS     EXTRACORPOREAL SHOCK WAVE LITHOTRIPSY Left 09/02/2017   Procedure: LEFT EXTRACORPOREAL SHOCK WAVE LITHOTRIPSY (ESWL);  Surgeon: Jerilee Field, MD;  Location: [facility];  Service: Urology;  Laterality: Left;   EXTRACORPOREAL SHOCK WAVE LITHOTRIPSY Left 06/26/2019   Procedure: EXTRACORPOREAL SHOCK WAVE LITHOTRIPSY (ESWL);  Surgeon: Jerilee Field, MD;  Location: [facility];  Service: Urology;  Laterality: Left;   EXTRACORPOREAL SHOCK WAVE LITHOTRIPSY Right 05/02/2020   Procedure: EXTRACORPOREAL SHOCK WAVE LITHOTRIPSY (ESWL);  Surgeon: Crist Fat, MD;  Location: [facility];  Service: Urology;  Laterality: Right;   REDUCTION MAMMAPLASTY      Prior to Admission medications   Medication Sig Start Date End Date Taking? Authorizing Provider  atorvastatin (LIPITOR) 10 MG tablet TAKE 1 TABLET BY MOUTH ONCE A DAY 03/08/23  Yes   diltiazem (CARDIZEM CD) 120 MG 24 hr  capsule Take 1 capsule (120 mg total) by mouth every evening. 05/19/22  Yes   escitalopram (LEXAPRO) 20 MG tablet Take 1 tablet (20 mg total) by mouth daily. 07/30/22  Yes Lesly Dukes, MD  ramelteon (ROZEREM) 8 MG tablet Take 1 tablet (8 mg total) by mouth at bedtime. 12/02/22  Yes   telmisartan (MICARDIS) 20 MG tablet Take 1 tablet (20 mg total) by mouth every evening. 06/16/22  Yes   tirzepatide (ZEPBOUND) 10 MG/0.5ML Pen Inject 10 mg into the skin once a week. 03/24/23  Yes Lesly Dukes, MD  ALPRAZolam Prudy Feeler) 0.5 MG tablet Take 1/2-1 tablet by mouth twice a day as needed for anxiety as directed. 30 days Patient not taking: Reported on 04/12/2023 10/06/21     Cyanocobalamin (VITAMIN B12) 1000 MCG TBCR 1 tablet Orally Once a day    [provider]  diclofenac (VOLTAREN) 75 MG EC tablet Take 1 tablet (75 mg total) by mouth 2 (two) times daily with a meal. Patient not taking: Reported on 04/12/2023 07/20/22     ondansetron (ZOFRAN) 4 MG tablet Take 1 tablet (4 mg total) by mouth every 6 (six) hours as needed. Patient not taking: Reported on 04/12/2023 07/12/22     Suvorexant (BELSOMRA) 20 MG TABS Take 1 tablet by mouth at bedtime as needed. Patient not taking: Reported on 04/12/2023 10/29/21     tiZANidine (ZANAFLEX) 2 MG tablet Take 1-2 tablets (2-4 mg total) by mouth 2 (two) times daily for spasms Patient not taking: Reported on 04/12/2023  07/20/22     Zoster Vaccine Adjuvanted [facility]) injection Inject into the muscle. 03/26/23   Judyann Munson, MD    Current Outpatient Medications  Medication Sig Dispense Refill   atorvastatin (LIPITOR) 10 MG tablet TAKE 1 TABLET BY MOUTH ONCE A DAY 90 tablet 3   diltiazem (CARDIZEM CD) 120 MG 24 hr capsule Take 1 capsule (120 mg total) by mouth every evening. 90 capsule 4   escitalopram (LEXAPRO) 20 MG tablet Take 1 tablet (20 mg total) by mouth daily. 30 tablet 12   ramelteon (ROZEREM) 8 MG tablet Take 1 tablet (8 mg total) by mouth at bedtime. 30  tablet 11   telmisartan (MICARDIS) 20 MG tablet Take 1 tablet (20 mg total) by mouth every evening. 90 tablet 4   tirzepatide (ZEPBOUND) 10 MG/0.5ML Pen Inject 10 mg into the skin once a week. 2 mL 0   ALPRAZolam (XANAX) 0.5 MG tablet Take 1/2-1 tablet by mouth twice a day as needed for anxiety as directed. 30 days (Patient not taking: Reported on 04/12/2023) 30 tablet 0   Cyanocobalamin (VITAMIN B12) 1000 MCG TBCR 1 tablet Orally Once a day     diclofenac (VOLTAREN) 75 MG EC tablet Take 1 tablet (75 mg total) by mouth 2 (two) times daily with a meal. (Patient not taking: Reported on 04/12/2023) 60 tablet 1   ondansetron (ZOFRAN) 4 MG tablet Take 1 tablet (4 mg total) by mouth every 6 (six) hours as needed. (Patient not taking: Reported on 04/12/2023) 30 tablet 1   Suvorexant (BELSOMRA) 20 MG TABS Take 1 tablet by mouth at bedtime as needed. (Patient not taking: Reported on 04/12/2023) 30 tablet 5   tiZANidine (ZANAFLEX) 2 MG tablet Take 1-2 tablets (2-4 mg total) by mouth 2 (two) times daily for spasms (Patient not taking: Reported on 04/12/2023) 40 tablet 1   Zoster Vaccine Adjuvanted [facility]) injection Inject into the muscle. 1 each 1   Current Facility-Administered Medications  Medication Dose Route Frequency Provider Last Rate Last Admin   0.9 %  sodium chloride infusion  500 mL Intravenous Once Napoleon Form, MD        Allergies as of 04/12/2023 - Review Complete 04/12/2023  Allergen Reaction Noted   Hydromorphone Swelling and Anaphylaxis 12/18/2011    Family History  Problem Relation Age of Onset   Colon polyps Father    Colon cancer Neg Hx    Esophageal cancer Neg Hx    Rectal cancer Neg Hx    Stomach cancer Neg Hx     Social History   Socioeconomic History   Marital status: Married    Spouse name: Not on file   Number of children: Not on file   Years of education: Not on file   Highest education level: Not on file  Occupational History   Not on file  Tobacco Use    Smoking status: Never    Passive exposure: Never   Smokeless tobacco: Never  Vaping Use   Vaping status: Never Used  Substance and Sexual Activity   Alcohol use: Yes    Comment: wine socially   Drug use: No   Sexual activity: Yes    Birth control/protection: None  Other Topics Concern   Not on file  Social History Narrative   Not on file   Social Drivers of Health   Financial Resource Strain: Not on file  Food Insecurity: Not on file  Transportation Needs: Not on file  Physical Activity: Not on file  Stress:  Not on file  Social Connections: Not on file  Intimate Partner Violence: Not on file    Review of Systems:  All other review of systems negative except as mentioned in the HPI.  Physical Exam: Vital signs in last 24 hours: BP (!) 156/108   Pulse 81   Temp 99 F (37.2 C) (Temporal)   Resp 16   Ht 5' 7.5" (1.715 m)   Wt 161 lb (73 kg)   SpO2 99%   BMI 24.84 kg/m  General:   Alert, NAD Lungs:  Clear .   Heart:  Regular rate and rhythm Abdomen:  Soft, nontender and nondistended. Neuro/Psych:  Alert and cooperative. Normal mood and affect. A and O x 3  Reviewed labs, radiology imaging, old records and pertinent past GI work up  Patient is appropriate for planned procedure(s) and anesthesia in an ambulatory setting   K. Scherry Ran , MD [phone]

## 2023-04-12 NOTE — Patient Instructions (Addendum)
Resume previous diet Continue present medications Await pathology results Repeat colonoscopy in 5 years  Handouts/information given for hemorrhoids  YOU HAD AN ENDOSCOPIC PROCEDURE TODAY AT THE [facility]:   Refer to the procedure report that was given to you for any specific questions about what was found during the examination.  If the procedure report does not answer your questions, please call your gastroenterologist to clarify.  If you requested that your care partner not be given the details of your procedure findings, then the procedure report has been included in a sealed envelope for you to review at your convenience later.  YOU SHOULD EXPECT: Some feelings of bloating in the abdomen. Passage of more gas than usual.  Walking can help get rid of the air that was put into your GI tract during the procedure and reduce the bloating. If you had a lower endoscopy (such as a colonoscopy or flexible sigmoidoscopy) you may notice spotting of blood in your stool or on the toilet paper. If you underwent a bowel prep for your procedure, you may not have a normal bowel movement for a few days.  Please Note:  You might notice some irritation and congestion in your nose or some drainage.  This is from the oxygen used during your procedure.  There is no need for concern and it should clear up in a day or so.  SYMPTOMS TO REPORT IMMEDIATELY:  Following lower endoscopy (colonoscopy):  Excessive amounts of blood in the stool  Significant tenderness or worsening of abdominal pains  Swelling of the abdomen that is new, acute  Fever of 100F or higher  For urgent or emergent issues, a gastroenterologist can be reached at any hour by calling [phone]. Do not use MyChart messaging for urgent concerns.   DIET:  We do recommend a small meal at first, but then you may proceed to your regular diet.  Drink plenty of fluids but you should avoid alcoholic beverages for 24 hours.  ACTIVITY:   You should plan to take it easy for the rest of today and you should NOT DRIVE or use heavy machinery until tomorrow (because of the sedation medicines used during the test).    FOLLOW UP: Our staff will call the number listed on your records the next business day following your procedure.  We will call around 7:15- 8:00 am to check on you and address any questions or concerns that you may have regarding the information given to you following your procedure. If we do not reach you, we will leave a message.     If any biopsies were taken you will be contacted by phone or by letter within the next 1-3 weeks.  Please call us at [phone] if you have not heard about the biopsies in 3 weeks.   SIGNATURES/CONFIDENTIALITY: You and/or your care partner have signed paperwork which will be entered into your electronic medical record.  These signatures attest to the fact that that the information above on your After Visit Summary has been reviewed and is understood.  Full responsibility of the confidentiality of this discharge information lies with you and/or your care-partner.

## 2023-04-13 ENCOUNTER — Telehealth: Payer: Self-pay | Admitting: *Deleted

## 2023-04-13 ENCOUNTER — Other Ambulatory Visit: Payer: Self-pay | Admitting: *Deleted

## 2023-04-13 DIAGNOSIS — R933 Abnormal findings on diagnostic imaging of other parts of digestive tract: Secondary | ICD-10-CM

## 2023-04-13 DIAGNOSIS — C179 Malignant neoplasm of small intestine, unspecified: Secondary | ICD-10-CM

## 2023-04-13 DIAGNOSIS — D3A8 Other benign neuroendocrine tumors: Secondary | ICD-10-CM

## 2023-04-13 NOTE — Telephone Encounter (Signed)
  Follow up Call-     04/12/2023    7:11 AM  Call back number  Post procedure Call Back phone  # [phone]  Permission to leave phone message Yes     Patient questions:  Do you have a fever, pain , or abdominal swelling? No. Pain Score  0 *  Have you tolerated food without any problems? Yes.    Have you been able to return to your normal activities? Yes.    Do you have any questions about your discharge instructions: Diet   No. Medications  No. Follow up visit  No.  Do you have questions or concerns about your Care? No.  Actions: * If pain score is 4 or above: No action needed, pain <4.

## 2023-04-13 NOTE — Telephone Encounter (Signed)
Ashley Edwards

## 2023-04-14 ENCOUNTER — Encounter: Payer: Self-pay | Admitting: Gastroenterology

## 2023-04-14 LAB — SURGICAL PATHOLOGY

## 2023-04-23 ENCOUNTER — Encounter (HOSPITAL_COMMUNITY)
Admission: RE | Admit: 2023-04-23 | Discharge: 2023-04-23 | Disposition: A | Discharge status: Home / Self Care | Payer: 59 | Source: Ambulatory Visit | Attending: Gastroenterology | Admitting: Gastroenterology

## 2023-04-23 DIAGNOSIS — R933 Abnormal findings on diagnostic imaging of other parts of digestive tract: Secondary | ICD-10-CM | POA: Insufficient documentation

## 2023-04-23 DIAGNOSIS — C179 Malignant neoplasm of small intestine, unspecified: Secondary | ICD-10-CM | POA: Diagnosis present

## 2023-04-23 DIAGNOSIS — D3A8 Other benign neuroendocrine tumors: Secondary | ICD-10-CM | POA: Insufficient documentation

## 2023-04-23 MED ORDER — COPPER CU 64 DOTATATE 1 MCI/ML IV SOLN
4.0000 | Freq: Once | INTRAVENOUS | Status: AC
  Administered 2023-04-23: 4.01 via INTRAVENOUS

## 2023-04-26 ENCOUNTER — Encounter: Payer: Self-pay | Admitting: Gastroenterology

## 2023-04-26 NOTE — Telephone Encounter (Signed)
 Inbound call from patient requesting a call regarding previous note. Please advise, thank you.

## 2023-04-27 ENCOUNTER — Encounter: Payer: Self-pay | Admitting: Obstetrics & Gynecology

## 2023-04-27 ENCOUNTER — Encounter: Payer: Self-pay | Admitting: Gastroenterology

## 2023-04-27 NOTE — Telephone Encounter (Signed)
Inbound call from patient stating she would like a call to discuss if there is anyway she can have the referral processed to the surgeon. Patient states she would like to be seen sooner than later. Please advise.

## 2023-04-28 ENCOUNTER — Encounter: Payer: Self-pay | Admitting: Gastroenterology

## 2023-04-28 NOTE — Telephone Encounter (Signed)
Inbound call from patient stating that she spoke with Dr. Maisie Fus office and advised her that they have not received a referral for patient. Patient states she got a call from a docotors office with [facility] but wanted to see Dr. Maisie Fus. I advised her the referral was sent yesterday around 11. Patient requested referral be resent and a call to be advised it has been sent. Please advise.

## 2023-04-29 ENCOUNTER — Other Ambulatory Visit: Payer: Self-pay | Admitting: Obstetrics & Gynecology

## 2023-04-29 ENCOUNTER — Other Ambulatory Visit (HOSPITAL_COMMUNITY): Payer: Self-pay

## 2023-04-29 MED ORDER — ZEPBOUND 12.5 MG/0.5ML ~~LOC~~ SOAJ
12.5000 mg | SUBCUTANEOUS | 2 refills | Status: DC
  Filled 2023-04-29: qty 2, 28d supply, fill #0

## 2023-05-03 ENCOUNTER — Other Ambulatory Visit (HOSPITAL_COMMUNITY): Payer: Self-pay

## 2023-05-03 ENCOUNTER — Ambulatory Visit: Payer: Self-pay | Admitting: General Surgery

## 2023-05-03 MED ORDER — NEOMYCIN SULFATE 500 MG PO TABS
ORAL_TABLET | ORAL | 0 refills | Status: DC
  Filled 2023-05-03: qty 6, 1d supply, fill #0

## 2023-05-03 MED ORDER — BISACODYL 5 MG PO TBEC
DELAYED_RELEASE_TABLET | ORAL | 0 refills | Status: DC
  Filled 2023-05-03: qty 4, 1d supply, fill #0

## 2023-05-03 MED ORDER — METRONIDAZOLE 500 MG PO TABS
ORAL_TABLET | ORAL | 0 refills | Status: DC
  Filled 2023-05-03: qty 6, 1d supply, fill #0

## 2023-05-03 MED ORDER — POLYETHYLENE GLYCOL 3350 17 GM/SCOOP PO POWD
ORAL | 0 refills | Status: DC
  Filled 2023-05-03: qty 238, 1d supply, fill #0

## 2023-05-03 MED ORDER — ONDANSETRON HCL 4 MG PO TABS
ORAL_TABLET | ORAL | 0 refills | Status: AC
Start: 2023-05-03 — End: ?
  Filled 2023-05-03: qty 2, 1d supply, fill #0

## 2023-05-03 NOTE — H&P (Signed)
REFERRING PHYSICIAN:  Montel Culver, MD  PROVIDER:  Elenora Gamma, MD  MRN: [id number] DOB: December 22, 1970 DATE OF ENCOUNTER: 05/03/2023   History of Present Illness: Ashley Edwards is a 53 y.o. female who is seen today as an office consultation at the request of Dr. Vida Roller for evaluation of New Consultation .  Pt underwent a recent screening colonoscopy and was noted to have a bleeding 2 cm ileal mass.  This was biopsied but the specimen was inconclusive.  She recently had a CT Abd in Nov 24, which did not show any signs of metastatic disease. Dodatate PET was performed on 04/23/23 and showed a single focus of activity in the terminal ileum region, ~4mm in size and 1.5 cm from ICV, no signs of metastatic disease.  Pt has a h/o extensive pelvic surgery for endometriosis and ovarian mass.       Review of Systems: A complete review of systems was obtained from the patient.  I have reviewed this information and discussed as appropriate with the patient.  See HPI as well for other ROS.    Medical History: Past Medical History: Diagnosis Date  GERD (gastroesophageal reflux disease)   Hyperlipidemia   Hypertension    There is no problem list on file for this patient.   Past Surgical History: Procedure Laterality Date  Neck surgery    Ovary removed      Allergies Allergen Reactions  Hydromorphone Anaphylaxis, Shortness Of Breath and Swelling   Can take Percocet  Meperidine Shortness Of Breath   Current Outpatient Medications on File Prior to Visit Medication Sig Dispense Refill  atorvastatin (LIPITOR) 10 MG tablet Take 10 mg by mouth at bedtime    dilTIAZem (CARDIZEM CD) 120 MG XR capsule Take 120 mg by mouth    omeprazole (PRILOSEC OTC) 20 MG EC tablet     telmisartan (MICARDIS) 20 MG tablet Take 20 mg by mouth every morning    No current facility-administered medications on file prior to visit.   Family History Problem Relation Age of Onset  High  blood pressure (Hypertension) Father   Hyperlipidemia (Elevated cholesterol) Father     Social History  Tobacco Use Smoking Status Never Smokeless Tobacco Never    Social History  Socioeconomic History  Marital status: Married Tobacco Use  Smoking status: Never  Smokeless tobacco: Never Substance and Sexual Activity  Alcohol use: Yes  Drug use: Never  Social Drivers of Health  Housing Stability: Unknown (05/03/2023)  Housing Stability Vital Sign   Homeless in the Last Year: No   Objective:   Vitals:  05/03/23 0944 05/03/23 0945 BP: (!) 132/98  Pulse: 52  Temp: 37.1 C (98.7 F)  SpO2: 97%  Weight: 76.9 kg (169 lb 9.6 oz)  Height: 170.2 cm (5\' 7" )  PainSc:  0-No pain    Exam Gen: NAD Abd: soft    Labs, Imaging and Diagnostic Testing: Dodatate PET reviewed.  Activity noted at ICV  Assessment and Plan: Diagnoses and all orders for this visit:  Neuroendocrine tumor (CMS-HCC)     53 y.o. F with NET of TI.  We have discussed the srugery in detail.  We discussed either performing an ileocecectomy or R colectomy robotically to remove the pathology, depending on orientation and blood supply.  We discussed the risk of B12 absorption after surgery and the need for having levels checked in the months following the surgery.  We discussed the risks of surgery in detail as well as post operative care and  symptoms.  All questions were answered.  Pt would like to proceed as soon as possible.   The surgery and anatomy were described to the patient as well as the risks of surgery and the possible complications.  These include: Bleeding, deep abdominal infections and possible wound complications such as hernia and infection, damage to adjacent structures, leak of surgical connections, which can lead to other surgeries and possibly an ostomy, possible need for other procedures, such as abscess drains in radiology, possible prolonged hospital stay, possible diarrhea from removal  of part of the colon, possible constipation from narcotics, possible bowel, bladder or sexual dysfunction if having rectal surgery, prolonged fatigue/weakness or appetite loss, possible early recurrence of of disease, possible complications of their medical problems such as heart disease or arrhythmias or lung problems, death (less than 1%). I believe the patient understands and wishes to proceed with the surgery.   Vanita Panda, MD Colon and Rectal Surgery [facility]

## 2023-05-03 NOTE — H&P (View-Only) (Signed)
 REFERRING PHYSICIAN:  Montel Culver, MD  PROVIDER:  Elenora Gamma, MD  MRN: [id number] DOB: December 22, 1970 DATE OF ENCOUNTER: 05/03/2023   History of Present Illness: Ashley Edwards is a 53 y.o. female who is seen today as an office consultation at the request of Dr. Vida Roller for evaluation of New Consultation .  Pt underwent a recent screening colonoscopy and was noted to have a bleeding 2 cm ileal mass.  This was biopsied but the specimen was inconclusive.  She recently had a CT Abd in Nov 24, which did not show any signs of metastatic disease. Dodatate PET was performed on 04/23/23 and showed a single focus of activity in the terminal ileum region, ~4mm in size and 1.5 cm from ICV, no signs of metastatic disease.  Pt has a h/o extensive pelvic surgery for endometriosis and ovarian mass.       Review of Systems: A complete review of systems was obtained from the patient.  I have reviewed this information and discussed as appropriate with the patient.  See HPI as well for other ROS.    Medical History: Past Medical History: Diagnosis Date  GERD (gastroesophageal reflux disease)   Hyperlipidemia   Hypertension    There is no problem list on file for this patient.   Past Surgical History: Procedure Laterality Date  Neck surgery    Ovary removed      Allergies Allergen Reactions  Hydromorphone Anaphylaxis, Shortness Of Breath and Swelling   Can take Percocet  Meperidine Shortness Of Breath   Current Outpatient Medications on File Prior to Visit Medication Sig Dispense Refill  atorvastatin (LIPITOR) 10 MG tablet Take 10 mg by mouth at bedtime    dilTIAZem (CARDIZEM CD) 120 MG XR capsule Take 120 mg by mouth    omeprazole (PRILOSEC OTC) 20 MG EC tablet     telmisartan (MICARDIS) 20 MG tablet Take 20 mg by mouth every morning    No current facility-administered medications on file prior to visit.   Family History Problem Relation Age of Onset  High  blood pressure (Hypertension) Father   Hyperlipidemia (Elevated cholesterol) Father     Social History  Tobacco Use Smoking Status Never Smokeless Tobacco Never    Social History  Socioeconomic History  Marital status: Married Tobacco Use  Smoking status: Never  Smokeless tobacco: Never Substance and Sexual Activity  Alcohol use: Yes  Drug use: Never  Social Drivers of Health  Housing Stability: Unknown (05/03/2023)  Housing Stability Vital Sign   Homeless in the Last Year: No   Objective:   Vitals:  05/03/23 0944 05/03/23 0945 BP: (!) 132/98  Pulse: 52  Temp: 37.1 C (98.7 F)  SpO2: 97%  Weight: 76.9 kg (169 lb 9.6 oz)  Height: 170.2 cm (5\' 7" )  PainSc:  0-No pain    Exam Gen: NAD Abd: soft    Labs, Imaging and Diagnostic Testing: Dodatate PET reviewed.  Activity noted at ICV  Assessment and Plan: Diagnoses and all orders for this visit:  Neuroendocrine tumor (CMS-HCC)     53 y.o. F with NET of TI.  We have discussed the srugery in detail.  We discussed either performing an ileocecectomy or R colectomy robotically to remove the pathology, depending on orientation and blood supply.  We discussed the risk of B12 absorption after surgery and the need for having levels checked in the months following the surgery.  We discussed the risks of surgery in detail as well as post operative care and  symptoms.  All questions were answered.  Pt would like to proceed as soon as possible.   The surgery and anatomy were described to the patient as well as the risks of surgery and the possible complications.  These include: Bleeding, deep abdominal infections and possible wound complications such as hernia and infection, damage to adjacent structures, leak of surgical connections, which can lead to other surgeries and possibly an ostomy, possible need for other procedures, such as abscess drains in radiology, possible prolonged hospital stay, possible diarrhea from removal  of part of the colon, possible constipation from narcotics, possible bowel, bladder or sexual dysfunction if having rectal surgery, prolonged fatigue/weakness or appetite loss, possible early recurrence of of disease, possible complications of their medical problems such as heart disease or arrhythmias or lung problems, death (less than 1%). I believe the patient understands and wishes to proceed with the surgery.   Vanita Panda, MD Colon and Rectal Surgery [facility] Surgery

## 2023-05-04 ENCOUNTER — Other Ambulatory Visit (HOSPITAL_COMMUNITY): Payer: Self-pay

## 2023-05-06 ENCOUNTER — Encounter (HOSPITAL_COMMUNITY): Payer: Self-pay

## 2023-05-06 NOTE — Patient Instructions (Addendum)
 SURGICAL WAITING ROOM VISITATION Patients having surgery or a procedure may have no more than 2 support people in the waiting area - these visitors may rotate.    Children under the age of 80 must have an adult with them who is not the patient.  Due to an increase in RSV and influenza rates and associated hospitalizations, children ages 83 and under may not visit patients in [facility] hospitals.   If the patient needs to stay at the hospital during part of their recovery, the visitor guidelines for inpatient rooms apply. Pre-op nurse will coordinate an appropriate time for 1 support person to accompany patient in pre-op.  This support person may not rotate.    Please refer to the [facility] website for the visitor guidelines for Inpatients (after your surgery is over and you are in a regular room).       Your procedure is scheduled on: 05-13-23   Report to [facility] Main Entrance    Report to admitting at 12:15 PM   Call this number if you have problems the morning of surgery [phone]   Follow a clear liquid diet day of prep to prevent dehydration   Do not eat food :After Midnight.   After Midnight you may have the following liquids until 11:30 AM DAY OF SURGERY  Water Non-Citrus Juices (without pulp, NO RED-Apple, White grape, White cranberry) Black Coffee (NO MILK/CREAM OR CREAMERS, sugar ok)  Clear Tea (NO MILK/CREAM OR CREAMERS, sugar ok) regular and decaf                             Plain Jell-O (NO RED)                                           Fruit ices (not with fruit pulp, NO RED)                                     Popsicles (NO RED)                                                               Sports drinks like Gatorade (NO RED)  Drink 2 Pre-surgery Ensure the evening before surgery                    The day of surgery:  Drink ONE (1) Pre-Surgery Clear Ensure by 11:30 AM the morning of surgery. Drink in one sitting. Do not sip.  This drink  was given to you during your hospital  pre-op appointment visit. Nothing else to drink after completing the Pre-Surgery Clear Ensure .          If you have questions, please contact your surgeon's office.   FOLLOW BOWEL PREP AND ANY ADDITIONAL PRE OP INSTRUCTIONS YOU RECEIVED FROM YOUR SURGEON'S OFFICE!!!   -Clear liquids day of prep to prevent dehydration  - Bisacodyl (Dulcolax) 20 mg - Give with water the day prior to surgery.   - Miralax 255g - Mix with 64 oz Gatorade/Powerade.  Drink  gradually over the next few hours (8 oz glass every 15-30 minutes) until gone the day prior to surgery.   -Metronidazole (Flagyl) 1000 mg - At 2 pm, 3 pm and 10 pm after Miralax bowel prep the day prior to surgery.    -Neomycin 1000 mg - At 2 pm, 3 pm and 10 pm after Miralax  bowel prep the day prior to surgery.    Oral Hygiene is also important to reduce your risk of infection.                                    Remember - BRUSH YOUR TEETH THE MORNING OF SURGERY WITH YOUR REGULAR TOOTHPASTE   Do NOT smoke after Midnight   Take these medicines the morning of surgery with A SIP OF WATER:    None  Stop all vitamins and herbal supplements 7 days before surgery  Hold Zepbound 7 days before surgery                               You may not have any metal on your body including hair pins, jewelry, and body piercing             Do not wear make-up, lotions, powders, perfumes, or deodorant  Do not wear nail polish including gel and S&S, artificial/acrylic nails, or any other type of covering on natural nails including finger and toenails. If you have artificial nails, gel coating, etc. that needs to be removed by a nail salon please have this removed prior to surgery or surgery may need to be canceled/ delayed if the surgeon/ anesthesia feels like they are unable to be safely monitored.   Do not shave  48 hours prior to surgery.    Do not bring valuables to the hospital. [facility] IS NOT RESPONSIBLE    FOR VALUABLES.   Contacts, dentures or bridgework may not be worn into surgery.   Bring small overnight bag day of surgery.   DO NOT BRING YOUR HOME MEDICATIONS TO THE HOSPITAL. PHARMACY WILL DISPENSE MEDICATIONS LISTED ON YOUR MEDICATION LIST TO YOU DURING YOUR ADMISSION IN THE HOSPITAL!   Special Instructions: Bring a copy of your healthcare power of attorney and living will documents the day of surgery if you haven't scanned them before.              Please read over the following fact sheets you were given: IF YOU HAVE QUESTIONS ABOUT YOUR PRE-OP INSTRUCTIONS PLEASE CALL [phone] [facility]  If you received a COVID test during your pre-op visit  it is requested that you wear a mask when out in public, stay away from anyone that may not be feeling well and notify your surgeon if you develop symptoms. If you test positive for Covid or have been in contact with anyone that has tested positive in the last 10 days please notify you surgeon.  [place] - Preparing for Surgery Before surgery, you can play an important role.  Because skin is not sterile, your skin needs to be as free of germs as possible.  You can reduce the number of germs on your skin by washing with CHG (chlorahexidine gluconate) soap before surgery.  CHG is an antiseptic cleaner which kills germs and bonds with the skin to continue killing germs even after washing. Please DO NOT use if you have  an allergy to CHG or antibacterial soaps.  If your skin becomes reddened/irritated stop using the CHG and inform your nurse when you arrive at Short Stay. Do not shave (including legs and underarms) for at least 48 hours prior to the first CHG shower.  You may shave your face/neck.  Please follow these instructions carefully:  1.  Shower with CHG Soap the night before surgery and the  morning of surgery.  2.  If you choose to wash your hair, wash your hair first as usual with your normal  shampoo.  3.  After you shampoo, rinse your  hair and body thoroughly to remove the shampoo.                             4.  Use CHG as you would any other liquid soap.  You can apply chg directly to the skin and wash.  Gently with a scrungie or clean washcloth.  5.  Apply the CHG Soap to your body ONLY FROM THE NECK DOWN.   Do   not use on face/ open                           Wound or open sores. Avoid contact with eyes, ears mouth and   genitals (private parts).                       Wash face,  Genitals (private parts) with your normal soap.             6.  Wash thoroughly, paying special attention to the area where your    surgery  will be performed.  7.  Thoroughly rinse your body with warm water from the neck down.  8.  DO NOT shower/wash with your normal soap after using and rinsing off the CHG Soap.                9.  Pat yourself dry with a clean towel.            10.  Wear clean pajamas.            11.  Place clean sheets on your bed the night of your first shower and do not  sleep with pets. Day of Surgery : Do not apply any lotions/deodorants the morning of surgery.  Please wear clean clothes to the hospital/surgery center.  FAILURE TO FOLLOW THESE INSTRUCTIONS MAY RESULT IN THE CANCELLATION OF YOUR SURGERY  PATIENT SIGNATURE_________________________________  NURSE SIGNATURE__________________________________  ________________________________________________________________________   WHAT IS A BLOOD TRANSFUSION? Blood Transfusion Information  A transfusion is the replacement of blood or some of its parts. Blood is made up of multiple cells which provide different functions. Red blood cells carry oxygen and are used for blood loss replacement. White blood cells fight against infection. Platelets control bleeding. Plasma helps clot blood. Other blood products are available for specialized needs, such as hemophilia or other clotting disorders. BEFORE THE TRANSFUSION  Who gives blood for transfusions?  Healthy volunteers  who are fully evaluated to make sure their blood is safe. This is blood bank blood. Transfusion therapy is the safest it has ever been in the practice of medicine. Before blood is taken from a donor, a complete history is taken to make sure that person has no history of diseases nor engages in risky social behavior (examples are intravenous drug use or  sexual activity with multiple partners). The donor's travel history is screened to minimize risk of transmitting infections, such as malaria. The donated blood is tested for signs of infectious diseases, such as HIV and hepatitis. The blood is then tested to be sure it is compatible with you in order to minimize the chance of a transfusion reaction. If you or a relative donates blood, this is often done in anticipation of surgery and is not appropriate for emergency situations. It takes many days to process the donated blood. RISKS AND COMPLICATIONS Although transfusion therapy is very safe and saves many lives, the main dangers of transfusion include:  Getting an infectious disease. Developing a transfusion reaction. This is an allergic reaction to something in the blood you were given. Every precaution is taken to prevent this. The decision to have a blood transfusion has been considered carefully by your caregiver before blood is given. Blood is not given unless the benefits outweigh the risks. AFTER THE TRANSFUSION Right after receiving a blood transfusion, you will usually feel much better and more energetic. This is especially true if your red blood cells have gotten low (anemic). The transfusion raises the level of the red blood cells which carry oxygen, and this usually causes an energy increase. The nurse administering the transfusion will monitor you carefully for complications. HOME CARE INSTRUCTIONS  No special instructions are needed after a transfusion. You may find your energy is better. Speak with your caregiver about any limitations on  activity for underlying diseases you may have. SEEK MEDICAL CARE IF:  Your condition is not improving after your transfusion. You develop redness or irritation at the intravenous (IV) site. SEEK IMMEDIATE MEDICAL CARE IF:  Any of the following symptoms occur over the next 12 hours: Shaking chills. You have a temperature by mouth above 102 F (38.9 C), not controlled by medicine. Chest, back, or muscle pain. People around you feel you are not acting correctly or are confused. Shortness of breath or difficulty breathing. Dizziness and fainting. You get a rash or develop hives. You have a decrease in urine output. Your urine turns a dark color or changes to pink, red, or brown. Any of the following symptoms occur over the next 10 days: You have a temperature by mouth above 102 F (38.9 C), not controlled by medicine. Shortness of breath. Weakness after normal activity. The white part of the eye turns yellow (jaundice). You have a decrease in the amount of urine or are urinating less often. Your urine turns a dark color or changes to pink, red, or brown. Document Released: 02/28/2000 Document Revised: 05/25/2011 Document Reviewed: 10/17/2007 [employer] Patient Information 2014 [place], [place].  _______________________________________________________________________   Incentive Spirometer  An incentive spirometer is a tool that can help keep your lungs clear and active. This tool measures how well you are filling your lungs with each breath. Taking long deep breaths may help reverse or decrease the chance of developing breathing (pulmonary) problems (especially infection) following: A long period of time when you are unable to move or be active. BEFORE THE PROCEDURE  If the spirometer includes an indicator to show your best effort, your nurse or respiratory therapist will set it to a desired goal. If possible, sit up straight or lean slightly forward. Try not to slouch. Hold the  incentive spirometer in an upright position. INSTRUCTIONS FOR USE  Sit on the edge of your bed if possible, or sit up as far as you can in bed or on a chair.  Hold the incentive spirometer in an upright position. Breathe out normally. Place the mouthpiece in your mouth and seal your lips tightly around it. Breathe in slowly and as deeply as possible, raising the piston or the ball toward the top of the column. Hold your breath for 3-5 seconds or for as long as possible. Allow the piston or ball to fall to the bottom of the column. Remove the mouthpiece from your mouth and breathe out normally. Rest for a few seconds and repeat Steps 1 through 7 at least 10 times every 1-2 hours when you are awake. Take your time and take a few normal breaths between deep breaths. The spirometer may include an indicator to show your best effort. Use the indicator as a goal to work toward during each repetition. After each set of 10 deep breaths, practice coughing to be sure your lungs are clear. If you have an incision (the cut made at the time of surgery), support your incision when coughing by placing a pillow or rolled up towels firmly against it. Once you are able to get out of bed, walk around indoors and cough well. You may stop using the incentive spirometer when instructed by your caregiver.  RISKS AND COMPLICATIONS Take your time so you do not get dizzy or light-headed. If you are in pain, you may need to take or ask for pain medication before doing incentive spirometry. It is harder to take a deep breath if you are having pain. AFTER USE Rest and breathe slowly and easily. It can be helpful to keep track of a log of your progress. Your caregiver can provide you with a simple table to help with this. If you are using the spirometer at home, follow these instructions: SEEK MEDICAL CARE IF:  You are having difficultly using the spirometer. You have trouble using the spirometer as often as instructed. Your  pain medication is not giving enough relief while using the spirometer. You develop fever of 100.5 F (38.1 C) or higher. SEEK IMMEDIATE MEDICAL CARE IF:  You cough up bloody sputum that had not been present before. You develop fever of 102 F (38.9 C) or greater. You develop worsening pain at or near the incision site. MAKE SURE YOU:  Understand these instructions. Will watch your condition. Will get help right away if you are not doing well or get worse. Document Released: 07/13/2006 Document Revised: 05/25/2011 Document Reviewed: 09/13/2006 [employer] Patient Information 2014 [place], [place].   ________________________________________________________________________

## 2023-05-06 NOTE — Progress Notes (Addendum)
 COVID Vaccine Completed:  Date of COVID positive in last 90 days:  Possibly 60 days ago  PCP - Rodrigo Ran, MD Cardiologist - N/A  Chest x-ray - N/A EKG - 05-10-23 Epic Stress Test -  N/A ECHO -  N/A Cardiac Cath -  N/A Pacemaker/ICD device last checked: Spinal Cord Stimulator: N/A CT Cardiac - 11-11-21 Epic  Bowel Prep - Yes, patient has instructions and prescriptions  Sleep Study -  Home sleep test, neg sleep apnea CPAP - No  Fasting Blood Sugar - N/A Checks Blood Sugar _____ times a day  Zepbound  (for weight loss) Last dose of GLP1 agonist-  04-27-23) GLP1 instructions:  Hold until after surgery   Last dose of SGLT-2 inhibitors-  N/A SGLT-2 instructions:  Hold 3 days before surgery   Blood Thinner Instructions:  N/A: Aspirin Instructions: Last Dose:  Activity level:  Can go up a flight of stairs and perform activities of daily living without stopping and without symptoms of chest pain or shortness of breath.  Able to exercise without symptoms  Anesthesia review: N/A  Patient denies shortness of breath, fever, cough and chest pain at PAT appointment  Patient verbalized understanding of instructions that were given to them at the PAT appointment. Patient was also instructed that they will need to review over the PAT instructions again at home before surgery.

## 2023-05-10 ENCOUNTER — Encounter (HOSPITAL_COMMUNITY)
Admission: RE | Admit: 2023-05-10 | Discharge: 2023-05-10 | Disposition: A | Discharge status: Home / Self Care | Payer: 59 | Source: Ambulatory Visit | Attending: General Surgery

## 2023-05-10 ENCOUNTER — Encounter (HOSPITAL_COMMUNITY): Payer: Self-pay | Admitting: *Deleted

## 2023-05-10 ENCOUNTER — Other Ambulatory Visit: Payer: Self-pay

## 2023-05-10 VITALS — BP 125/92 | HR 64 | Temp 98.1°F | Resp 12 | Ht 67.5 in | Wt 167.2 lb

## 2023-05-10 DIAGNOSIS — I10 Essential (primary) hypertension: Secondary | ICD-10-CM | POA: Insufficient documentation

## 2023-05-10 DIAGNOSIS — Z01818 Encounter for other preprocedural examination: Secondary | ICD-10-CM | POA: Insufficient documentation

## 2023-05-10 HISTORY — DX: Methicillin resistant Staphylococcus aureus infection, unspecified site: A49.02

## 2023-05-10 HISTORY — DX: Gastro-esophageal reflux disease without esophagitis: K21.9

## 2023-05-10 LAB — CBC
HCT: 44 % (ref 36.0–46.0)
Hemoglobin: 13.6 g/dL (ref 12.0–15.0)
MCH: 29.6 pg (ref 26.0–34.0)
MCHC: 30.9 g/dL (ref 30.0–36.0)
MCV: 95.7 fL (ref 80.0–100.0)
Platelets: 250 10*3/uL (ref 150–400)
RBC: 4.6 MIL/uL (ref 3.87–5.11)
RDW: 12.7 % (ref 11.5–15.5)
WBC: 6.6 10*3/uL (ref 4.0–10.5)
nRBC: 0 % (ref 0.0–0.2)

## 2023-05-10 LAB — BASIC METABOLIC PANEL
Anion gap: 7 (ref 5–15)
BUN: 16 mg/dL (ref 6–20)
CO2: 26 mmol/L (ref 22–32)
Calcium: 8.8 mg/dL — ABNORMAL LOW (ref 8.9–10.3)
Chloride: 103 mmol/L (ref 98–111)
Creatinine, Ser: 0.85 mg/dL (ref 0.44–1.00)
GFR, Estimated: >60 mL/min (ref >60)
Glucose, Bld: 97 mg/dL (ref 70–99)
Potassium: 4.9 mmol/L (ref 3.5–5.1)
Sodium: 136 mmol/L (ref 135–145)

## 2023-05-10 LAB — SURGICAL PCR SCREEN
MRSA, PCR: NEGATIVE
Staphylococcus aureus: NEGATIVE

## 2023-05-13 ENCOUNTER — Other Ambulatory Visit: Payer: Self-pay

## 2023-05-13 ENCOUNTER — Inpatient Hospital Stay (HOSPITAL_COMMUNITY)
Admission: RE | Admit: 2023-05-13 | Discharge: 2023-05-15 | DRG: 330 | Disposition: A | Discharge status: Home / Self Care | Payer: 59 | Attending: General Surgery | Admitting: General Surgery

## 2023-05-13 ENCOUNTER — Inpatient Hospital Stay (HOSPITAL_COMMUNITY): Payer: 59 | Admitting: Anesthesiology

## 2023-05-13 ENCOUNTER — Encounter (HOSPITAL_COMMUNITY): Payer: Self-pay | Admitting: General Surgery

## 2023-05-13 ENCOUNTER — Encounter (HOSPITAL_COMMUNITY)
Admission: RE | Disposition: A | Discharge status: Home / Self Care | Payer: Self-pay | Source: Home / Self Care | Attending: General Surgery

## 2023-05-13 DIAGNOSIS — C7A012 Malignant carcinoid tumor of the ileum: Secondary | ICD-10-CM | POA: Diagnosis present

## 2023-05-13 DIAGNOSIS — C182 Malignant neoplasm of ascending colon: Secondary | ICD-10-CM | POA: Diagnosis not present

## 2023-05-13 DIAGNOSIS — K219 Gastro-esophageal reflux disease without esophagitis: Secondary | ICD-10-CM | POA: Diagnosis present

## 2023-05-13 DIAGNOSIS — D3A00 Benign carcinoid tumor of unspecified site: Principal | ICD-10-CM | POA: Diagnosis present

## 2023-05-13 DIAGNOSIS — Z8249 Family history of ischemic heart disease and other diseases of the circulatory system: Secondary | ICD-10-CM

## 2023-05-13 DIAGNOSIS — I10 Essential (primary) hypertension: Secondary | ICD-10-CM | POA: Diagnosis present

## 2023-05-13 DIAGNOSIS — E785 Hyperlipidemia, unspecified: Secondary | ICD-10-CM | POA: Diagnosis present

## 2023-05-13 DIAGNOSIS — D4989 Neoplasm of unspecified behavior of other specified sites: Secondary | ICD-10-CM | POA: Diagnosis present

## 2023-05-13 DIAGNOSIS — Z79899 Other long term (current) drug therapy: Secondary | ICD-10-CM

## 2023-05-13 DIAGNOSIS — C7B01 Secondary carcinoid tumors of distant lymph nodes: Secondary | ICD-10-CM | POA: Diagnosis present

## 2023-05-13 LAB — TYPE AND SCREEN
ABO/RH(D): O POS
Antibody Screen: NEGATIVE

## 2023-05-13 LAB — ABO/RH: ABO/RH(D): O POS

## 2023-05-13 SURGERY — COLECTOMY, PARTIAL, ROBOT-ASSISTED, LAPAROSCOPIC
Anesthesia: General | Laterality: Right

## 2023-05-13 MED ORDER — ROCURONIUM BROMIDE 10 MG/ML (PF) SYRINGE
PREFILLED_SYRINGE | INTRAVENOUS | Status: DC | PRN
  Administered 2023-05-13: 60 mg via INTRAVENOUS
  Administered 2023-05-13: 40 mg via INTRAVENOUS

## 2023-05-13 MED ORDER — LACTATED RINGERS IR SOLN
Status: DC | PRN
  Administered 2023-05-13: 1000 mL

## 2023-05-13 MED ORDER — FENTANYL CITRATE PF 50 MCG/ML IJ SOSY
PREFILLED_SYRINGE | INTRAMUSCULAR | Status: AC
  Filled 2023-05-13: qty 1

## 2023-05-13 MED ORDER — HEPARIN SODIUM (PORCINE) 5000 UNIT/ML IJ SOLN
5000.0000 [IU] | Freq: Once | INTRAMUSCULAR | Status: AC
  Administered 2023-05-13: 5000 [IU] via SUBCUTANEOUS
  Filled 2023-05-13: qty 1

## 2023-05-13 MED ORDER — BUPIVACAINE LIPOSOME 1.3 % IJ SUSP
INTRAMUSCULAR | Status: AC
  Filled 2023-05-13: qty 20

## 2023-05-13 MED ORDER — KETAMINE HCL 10 MG/ML IJ SOLN
INTRAMUSCULAR | Status: DC | PRN
Start: 2023-05-13 — End: 2023-05-13
  Administered 2023-05-13: 50 mg via INTRAVENOUS

## 2023-05-13 MED ORDER — DEXMEDETOMIDINE HCL IN NACL 80 MCG/20ML IV SOLN
INTRAVENOUS | Status: DC | PRN
  Administered 2023-05-13: 12 ug via INTRAVENOUS
  Administered 2023-05-13: 8 ug via INTRAVENOUS

## 2023-05-13 MED ORDER — LIDOCAINE HCL (PF) 2 % IJ SOLN
INTRAMUSCULAR | Status: AC
  Filled 2023-05-13: qty 5

## 2023-05-13 MED ORDER — ESCITALOPRAM OXALATE 20 MG PO TABS
20.0000 mg | ORAL_TABLET | Freq: Every day | ORAL | Status: DC
  Administered 2023-05-13 – 2023-05-14 (×2): 20 mg via ORAL
  Filled 2023-05-13 (×2): qty 1

## 2023-05-13 MED ORDER — PHENYLEPHRINE 80 MCG/ML (10ML) SYRINGE FOR IV PUSH (FOR BLOOD PRESSURE SUPPORT)
PREFILLED_SYRINGE | INTRAVENOUS | Status: AC
Start: 2023-05-13 — End: ?
  Filled 2023-05-13: qty 10

## 2023-05-13 MED ORDER — KETOROLAC TROMETHAMINE 30 MG/ML IJ SOLN
INTRAMUSCULAR | Status: DC | PRN
  Administered 2023-05-13: 15 mg via INTRAVENOUS

## 2023-05-13 MED ORDER — KETAMINE HCL 50 MG/5ML IJ SOSY
PREFILLED_SYRINGE | INTRAMUSCULAR | Status: AC
  Filled 2023-05-13: qty 5

## 2023-05-13 MED ORDER — LACTATED RINGERS IV SOLN: INTRAVENOUS | Status: DC

## 2023-05-13 MED ORDER — ENOXAPARIN SODIUM 40 MG/0.4ML IJ SOSY
40.0000 mg | PREFILLED_SYRINGE | INTRAMUSCULAR | Status: DC
  Administered 2023-05-14 – 2023-05-15 (×2): 40 mg via SUBCUTANEOUS
  Filled 2023-05-13 (×2): qty 0.4

## 2023-05-13 MED ORDER — OXYCODONE HCL 5 MG/5ML PO SOLN: 5.0000 mg | Freq: Once | ORAL | Status: AC | PRN

## 2023-05-13 MED ORDER — IRBESARTAN 75 MG PO TABS
75.0000 mg | ORAL_TABLET | Freq: Every day | ORAL | Status: DC
  Administered 2023-05-14: 75 mg via ORAL
  Filled 2023-05-13 (×2): qty 1

## 2023-05-13 MED ORDER — BUPIVACAINE LIPOSOME 1.3 % IJ SUSP
INTRAMUSCULAR | Status: DC | PRN
  Administered 2023-05-13: 20 mL

## 2023-05-13 MED ORDER — MIDAZOLAM HCL 2 MG/2ML IJ SOLN
INTRAMUSCULAR | Status: AC
  Filled 2023-05-13: qty 2

## 2023-05-13 MED ORDER — 0.9 % SODIUM CHLORIDE (POUR BTL) OPTIME
TOPICAL | Status: DC | PRN
  Administered 2023-05-13: 2000 mL

## 2023-05-13 MED ORDER — ATORVASTATIN CALCIUM 10 MG PO TABS
10.0000 mg | ORAL_TABLET | Freq: Every day | ORAL | Status: DC
  Administered 2023-05-13 – 2023-05-14 (×2): 10 mg via ORAL
  Filled 2023-05-13 (×3): qty 1

## 2023-05-13 MED ORDER — BUPIVACAINE-EPINEPHRINE 0.25% -1:200000 IJ SOLN
INTRAMUSCULAR | Status: DC | PRN
  Administered 2023-05-13: 30 mL

## 2023-05-13 MED ORDER — GABAPENTIN 300 MG PO CAPS
300.0000 mg | ORAL_CAPSULE | ORAL | Status: AC
Start: 2023-05-13 — End: 2023-05-13
  Administered 2023-05-13: 300 mg via ORAL
  Filled 2023-05-13: qty 1

## 2023-05-13 MED ORDER — OXYCODONE HCL 5 MG PO TABS
ORAL_TABLET | ORAL | Status: AC
  Filled 2023-05-13: qty 1

## 2023-05-13 MED ORDER — LIDOCAINE HCL (CARDIAC) PF 100 MG/5ML IV SOSY
PREFILLED_SYRINGE | INTRAVENOUS | Status: DC | PRN
  Administered 2023-05-13: 100 mg via INTRATRACHEAL

## 2023-05-13 MED ORDER — METHYLENE BLUE (ANTIDOTE) 1 % IV SOLN
INTRAVENOUS | Status: AC
  Filled 2023-05-13: qty 10

## 2023-05-13 MED ORDER — DEXAMETHASONE SODIUM PHOSPHATE 10 MG/ML IJ SOLN
INTRAMUSCULAR | Status: DC | PRN
  Administered 2023-05-13: 10 mg via INTRAVENOUS

## 2023-05-13 MED ORDER — PROPOFOL 500 MG/50ML IV EMUL
INTRAVENOUS | Status: AC
  Filled 2023-05-13: qty 50

## 2023-05-13 MED ORDER — MORPHINE SULFATE (PF) 2 MG/ML IV SOLN
2.0000 mg | INTRAVENOUS | Status: DC | PRN
  Administered 2023-05-13 – 2023-05-14 (×3): 2 mg via INTRAVENOUS
  Filled 2023-05-13 (×3): qty 1

## 2023-05-13 MED ORDER — ONDANSETRON HCL 4 MG/2ML IJ SOLN
INTRAMUSCULAR | Status: DC | PRN
  Administered 2023-05-13: 4 mg via INTRAVENOUS

## 2023-05-13 MED ORDER — ALVIMOPAN 12 MG PO CAPS
12.0000 mg | ORAL_CAPSULE | ORAL | Status: AC
Start: 2023-05-13 — End: 2023-05-13
  Administered 2023-05-13: 12 mg via ORAL
  Filled 2023-05-13: qty 1

## 2023-05-13 MED ORDER — DEXAMETHASONE SODIUM PHOSPHATE 10 MG/ML IJ SOLN
INTRAMUSCULAR | Status: AC
  Filled 2023-05-13: qty 1

## 2023-05-13 MED ORDER — ROCURONIUM BROMIDE 10 MG/ML (PF) SYRINGE
PREFILLED_SYRINGE | INTRAVENOUS | Status: AC
  Filled 2023-05-13: qty 10

## 2023-05-13 MED ORDER — ALVIMOPAN 12 MG PO CAPS
12.0000 mg | ORAL_CAPSULE | Freq: Two times a day (BID) | ORAL | Status: DC
Start: 2023-05-14 — End: 2023-05-15
  Administered 2023-05-14 (×2): 12 mg via ORAL
  Filled 2023-05-13 (×2): qty 1

## 2023-05-13 MED ORDER — STERILE WATER FOR IRRIGATION IR SOLN
Status: DC | PRN
  Administered 2023-05-13: 1000 mL

## 2023-05-13 MED ORDER — DROPERIDOL 2.5 MG/ML IJ SOLN: 0.6250 mg | Freq: Once | INTRAMUSCULAR | Status: DC | PRN

## 2023-05-13 MED ORDER — MIDAZOLAM HCL 2 MG/2ML IJ SOLN
INTRAMUSCULAR | Status: DC | PRN
  Administered 2023-05-13: 2 mg via INTRAVENOUS

## 2023-05-13 MED ORDER — ALUM & MAG HYDROXIDE-SIMETH 200-200-20 MG/5ML PO SUSP: 30.0000 mL | Freq: Four times a day (QID) | ORAL | Status: DC | PRN

## 2023-05-13 MED ORDER — DIPHENHYDRAMINE HCL 12.5 MG/5ML PO ELIX: 12.5000 mg | ORAL_SOLUTION | Freq: Four times a day (QID) | ORAL | Status: DC | PRN

## 2023-05-13 MED ORDER — ACETAMINOPHEN 500 MG PO TABS
1000.0000 mg | ORAL_TABLET | ORAL | Status: AC
Start: 2023-05-13 — End: 2023-05-13
  Administered 2023-05-13: 1000 mg via ORAL
  Filled 2023-05-13: qty 2

## 2023-05-13 MED ORDER — DIPHENHYDRAMINE HCL 50 MG/ML IJ SOLN
12.5000 mg | Freq: Four times a day (QID) | INTRAMUSCULAR | Status: DC | PRN
  Administered 2023-05-14: 12.5 mg via INTRAVENOUS
  Filled 2023-05-13: qty 1

## 2023-05-13 MED ORDER — PHENYLEPHRINE 80 MCG/ML (10ML) SYRINGE FOR IV PUSH (FOR BLOOD PRESSURE SUPPORT)
PREFILLED_SYRINGE | INTRAVENOUS | Status: DC | PRN
Start: 2023-05-13 — End: 2023-05-13
  Administered 2023-05-13 (×6): 80 ug via INTRAVENOUS

## 2023-05-13 MED ORDER — PROPOFOL 10 MG/ML IV BOLUS
INTRAVENOUS | Status: AC
  Filled 2023-05-13: qty 20

## 2023-05-13 MED ORDER — ACETAMINOPHEN 10 MG/ML IV SOLN: 1000.0000 mg | Freq: Once | INTRAVENOUS | Status: DC | PRN

## 2023-05-13 MED ORDER — KETOROLAC TROMETHAMINE 30 MG/ML IJ SOLN
INTRAMUSCULAR | Status: AC
  Filled 2023-05-13: qty 1

## 2023-05-13 MED ORDER — ONDANSETRON HCL 4 MG PO TABS
4.0000 mg | ORAL_TABLET | Freq: Four times a day (QID) | ORAL | Status: DC | PRN
Start: 2023-05-13 — End: 2023-05-15

## 2023-05-13 MED ORDER — ENSURE PRE-SURGERY PO LIQD: 296.0000 mL | Freq: Once | ORAL | Status: DC

## 2023-05-13 MED ORDER — RAMELTEON 8 MG PO TABS
8.0000 mg | ORAL_TABLET | Freq: Every day | ORAL | Status: DC
  Administered 2023-05-13 – 2023-05-14 (×2): 8 mg via ORAL
  Filled 2023-05-13 (×2): qty 1

## 2023-05-13 MED ORDER — SCOPOLAMINE 1 MG/3DAYS TD PT72
1.0000 | MEDICATED_PATCH | TRANSDERMAL | Status: DC
  Administered 2023-05-13: 1.5 mg via TRANSDERMAL

## 2023-05-13 MED ORDER — SACCHAROMYCES BOULARDII 250 MG PO CAPS
250.0000 mg | ORAL_CAPSULE | Freq: Two times a day (BID) | ORAL | Status: DC
  Administered 2023-05-13 – 2023-05-15 (×4): 250 mg via ORAL
  Filled 2023-05-13 (×4): qty 1

## 2023-05-13 MED ORDER — POLYETHYLENE GLYCOL 3350 17 GM/SCOOP PO POWD: 1.0000 | Freq: Once | ORAL | Status: DC

## 2023-05-13 MED ORDER — SODIUM CHLORIDE 0.9 % IV SOLN
2.0000 g | Freq: Two times a day (BID) | INTRAVENOUS | Status: AC
  Administered 2023-05-14: 2 g via INTRAVENOUS
  Filled 2023-05-13: qty 2

## 2023-05-13 MED ORDER — GABAPENTIN 100 MG PO CAPS
300.0000 mg | ORAL_CAPSULE | Freq: Two times a day (BID) | ORAL | Status: DC
  Administered 2023-05-13 – 2023-05-15 (×4): 300 mg via ORAL
  Filled 2023-05-13 (×4): qty 3

## 2023-05-13 MED ORDER — DILTIAZEM HCL ER COATED BEADS 120 MG PO CP24
120.0000 mg | ORAL_CAPSULE | Freq: Every evening | ORAL | Status: DC
  Administered 2023-05-13 – 2023-05-14 (×2): 120 mg via ORAL
  Filled 2023-05-13 (×2): qty 1

## 2023-05-13 MED ORDER — SUGAMMADEX SODIUM 200 MG/2ML IV SOLN
INTRAVENOUS | Status: AC
  Filled 2023-05-13: qty 2

## 2023-05-13 MED ORDER — PROPOFOL 10 MG/ML IV BOLUS
INTRAVENOUS | Status: DC | PRN
  Administered 2023-05-13: 25 ug/kg/min via INTRAVENOUS
  Administered 2023-05-13: 100 mg via INTRAVENOUS

## 2023-05-13 MED ORDER — ACETAMINOPHEN 500 MG PO TABS
1000.0000 mg | ORAL_TABLET | Freq: Four times a day (QID) | ORAL | Status: DC
  Administered 2023-05-13 – 2023-05-15 (×7): 1000 mg via ORAL
  Filled 2023-05-13 (×7): qty 2

## 2023-05-13 MED ORDER — BUPIVACAINE LIPOSOME 1.3 % IJ SUSP
20.0000 mL | Freq: Once | INTRAMUSCULAR | Status: DC
  Administered 2023-05-13: 266 mg

## 2023-05-13 MED ORDER — KCL IN DEXTROSE-NACL 20-5-0.45 MEQ/L-%-% IV SOLN
INTRAVENOUS | Status: DC
  Filled 2023-05-13: qty 1000

## 2023-05-13 MED ORDER — SCOPOLAMINE 1 MG/3DAYS TD PT72
MEDICATED_PATCH | TRANSDERMAL | Status: AC
Start: 2023-05-13 — End: 2023-05-13
  Filled 2023-05-13: qty 1

## 2023-05-13 MED ORDER — CHLORHEXIDINE GLUCONATE 0.12 % MT SOLN
15.0000 mL | Freq: Once | OROMUCOSAL | Status: AC
  Administered 2023-05-13: 15 mL via OROMUCOSAL

## 2023-05-13 MED ORDER — BUPIVACAINE-EPINEPHRINE (PF) 0.25% -1:200000 IJ SOLN
INTRAMUSCULAR | Status: AC
  Filled 2023-05-13: qty 30

## 2023-05-13 MED ORDER — ONDANSETRON HCL 4 MG/2ML IJ SOLN
INTRAMUSCULAR | Status: AC
  Filled 2023-05-13: qty 2

## 2023-05-13 MED ORDER — ENSURE SURGERY PO LIQD
237.0000 mL | Freq: Two times a day (BID) | ORAL | Status: DC
  Administered 2023-05-14 (×2): 237 mL via ORAL

## 2023-05-13 MED ORDER — OXYCODONE HCL 5 MG PO TABS
5.0000 mg | ORAL_TABLET | Freq: Once | ORAL | Status: AC | PRN
  Administered 2023-05-13: 5 mg via ORAL

## 2023-05-13 MED ORDER — LACTATED RINGERS IV SOLN: INTRAVENOUS | Status: DC | PRN

## 2023-05-13 MED ORDER — ORAL CARE MOUTH RINSE: 15.0000 mL | Freq: Once | OROMUCOSAL | Status: AC

## 2023-05-13 MED ORDER — ENSURE PRE-SURGERY PO LIQD: 592.0000 mL | Freq: Once | ORAL | Status: DC

## 2023-05-13 MED ORDER — ONDANSETRON HCL 4 MG/2ML IJ SOLN: 4.0000 mg | Freq: Four times a day (QID) | INTRAMUSCULAR | Status: DC | PRN

## 2023-05-13 MED ORDER — CEFOTETAN DISODIUM 2 G IJ SOLR
2.0000 g | INTRAMUSCULAR | Status: AC
  Administered 2023-05-13: 2 g via INTRAVENOUS
  Filled 2023-05-13: qty 2

## 2023-05-13 MED ORDER — SIMETHICONE 80 MG PO CHEW
40.0000 mg | CHEWABLE_TABLET | Freq: Four times a day (QID) | ORAL | Status: DC | PRN
  Administered 2023-05-14: 40 mg via ORAL
  Filled 2023-05-13: qty 1

## 2023-05-13 MED ORDER — FENTANYL CITRATE PF 50 MCG/ML IJ SOSY
25.0000 ug | PREFILLED_SYRINGE | INTRAMUSCULAR | Status: DC | PRN
  Administered 2023-05-13: 50 ug via INTRAVENOUS

## 2023-05-13 MED ORDER — BISACODYL 5 MG PO TBEC: 20.0000 mg | DELAYED_RELEASE_TABLET | Freq: Once | ORAL | Status: DC

## 2023-05-13 MED ORDER — SUGAMMADEX SODIUM 200 MG/2ML IV SOLN
INTRAVENOUS | Status: DC | PRN
  Administered 2023-05-13: 200 mg via INTRAVENOUS

## 2023-05-13 SURGICAL SUPPLY — 74 items
BAG COUNTER SPONGE SURGICOUNT (BAG) ×1 IMPLANT
BLADE EXTENDED COATED 6.5IN (ELECTRODE) IMPLANT
CANNULA REDUCER 12-8 DVNC XI (CANNULA) IMPLANT
COVER SURGICAL LIGHT HANDLE (MISCELLANEOUS) ×2 IMPLANT
COVER TIP SHEARS 8 DVNC (MISCELLANEOUS) ×1 IMPLANT
DEFOGGER SCOPE WARMER CLEARIFY (MISCELLANEOUS) IMPLANT
DRAIN CHANNEL 19F RND (DRAIN) IMPLANT
DRAPE ARM DVNC X/XI (DISPOSABLE) ×4 IMPLANT
DRAPE COLUMN DVNC XI (DISPOSABLE) ×1 IMPLANT
DRAPE SURG IRRIG POUCH 19X23 (DRAPES) ×1 IMPLANT
DRIVER NDL LRG 8 DVNC XI (INSTRUMENTS) ×1 IMPLANT
DRIVER NDLE LRG 8 DVNC XI (INSTRUMENTS) ×1 IMPLANT
DRSG OPSITE POSTOP 4X6 (GAUZE/BANDAGES/DRESSINGS) IMPLANT
DRSG OPSITE POSTOP 4X8 (GAUZE/BANDAGES/DRESSINGS) IMPLANT
ELECT PENCIL ROCKER SW 15FT (MISCELLANEOUS) ×1 IMPLANT
ELECT REM PT RETURN 15FT ADLT (MISCELLANEOUS) ×1 IMPLANT
EVACUATOR SILICONE 100CC (DRAIN) IMPLANT
GLOVE BIO SURGEON STRL SZ 6.5 (GLOVE) ×3 IMPLANT
GLOVE INDICATOR 6.5 STRL GRN (GLOVE) ×3 IMPLANT
GOWN SRG XL LVL 4 BRTHBL STRL (GOWNS) ×1 IMPLANT
GOWN STRL REUS W/ TWL XL LVL3 (GOWN DISPOSABLE) ×3 IMPLANT
GRASPER SUT TROCAR 14GX15 (MISCELLANEOUS) IMPLANT
GRASPER TIP-UP FEN DVNC XI (INSTRUMENTS) ×1 IMPLANT
HOLDER FOLEY CATH W/STRAP (MISCELLANEOUS) ×1 IMPLANT
IRRIG SUCT STRYKERFLOW 2 WTIP (MISCELLANEOUS) ×1 IMPLANT
IRRIGATION SUCT STRKRFLW 2 WTP (MISCELLANEOUS) ×1 IMPLANT
KIT PROCEDURE DVNC SI (MISCELLANEOUS) IMPLANT
KIT TURNOVER KIT A (KITS) IMPLANT
NDL INSUFFLATION 14GA 120MM (NEEDLE) ×1 IMPLANT
NEEDLE INSUFFLATION 14GA 120MM (NEEDLE) ×1 IMPLANT
PACK CARDIOVASCULAR III (CUSTOM PROCEDURE TRAY) ×1 IMPLANT
PACK COLON (CUSTOM PROCEDURE TRAY) ×1 IMPLANT
PAD POSITIONING PINK XL (MISCELLANEOUS) ×1 IMPLANT
RELOAD STAPLE 60 2.5 WHT DVNC (STAPLE) IMPLANT
RELOAD STAPLE 60 3.5 BLU DVNC (STAPLE) IMPLANT
RELOAD STAPLE 60 4.3 GRN DVNC (STAPLE) IMPLANT
RETRACTOR WND ALEXIS 18 MED (MISCELLANEOUS) IMPLANT
RTRCTR WOUND ALEXIS 18CM MED (MISCELLANEOUS) IMPLANT
SCISSORS LAP 5X35 DISP (ENDOMECHANICALS) IMPLANT
SCISSORS MNPLR CVD DVNC XI (INSTRUMENTS) ×1 IMPLANT
SEAL UNIV 5-12 XI (MISCELLANEOUS) ×3 IMPLANT
SEALER VESSEL EXT DVNC XI (MISCELLANEOUS) ×1 IMPLANT
SOL ELECTROSURG ANTI STICK (MISCELLANEOUS) ×1 IMPLANT
SOLUTION ELECTROSURG ANTI STCK (MISCELLANEOUS) ×1 IMPLANT
SPIKE FLUID TRANSFER (MISCELLANEOUS) IMPLANT
STAPLER 60 SUREFORM DVNC (STAPLE) IMPLANT
STAPLER ECHELON POWER CIR 29 (STAPLE) IMPLANT
STAPLER ECHELON POWER CIR 31 (STAPLE) IMPLANT
STAPLER RELOAD 2.5X60 WHT DVNC (STAPLE) ×1 IMPLANT
STAPLER RELOAD 3.5X60 BLU DVNC (STAPLE) ×1 IMPLANT
STAPLER RELOAD 4.3X60 GRN DVNC (STAPLE) ×1 IMPLANT
SUT ETHILON 2 0 PS N (SUTURE) IMPLANT
SUT NOVA NAB GS-21 1 T12 (SUTURE) ×2 IMPLANT
SUT PROLENE 2 0 KS (SUTURE) IMPLANT
SUT SILK 2 0 SH CR/8 (SUTURE) IMPLANT
SUT SILK 2-0 18XBRD TIE 12 (SUTURE) ×1 IMPLANT
SUT SILK 3 0 SH CR/8 (SUTURE) ×1 IMPLANT
SUT SILK 3-0 18XBRD TIE 12 (SUTURE) IMPLANT
SUT V-LOC BARB 180 2/0GR6 GS22 (SUTURE) IMPLANT
SUT VIC AB 2-0 SH 18 (SUTURE) IMPLANT
SUT VIC AB 2-0 SH 27X BRD (SUTURE) IMPLANT
SUT VIC AB 3-0 SH 18 (SUTURE) IMPLANT
SUT VIC AB 4-0 PS2 27 (SUTURE) ×2 IMPLANT
SUT VICRYL 0 UR6 27IN ABS (SUTURE) ×1 IMPLANT
SUT VLOC 180 2-0 6IN GS21 (SUTURE) IMPLANT
SUTURE V-LC BRB 180 2/0GR6GS22 (SUTURE) IMPLANT
SYR 20ML ECCENTRIC (SYRINGE) ×1 IMPLANT
SYS WOUND ALEXIS 18CM MED (MISCELLANEOUS) IMPLANT
SYSTEM WOUND ALEXIS 18CM MED (MISCELLANEOUS) IMPLANT
TOWEL OR 17X26 10 PK STRL BLUE (TOWEL DISPOSABLE) IMPLANT
TRAY FOLEY MTR SLVR 16FR STAT (SET/KITS/TRAYS/PACK) ×1 IMPLANT
TROCAR ADV FIXATION 5X100MM (TROCAR) ×1 IMPLANT
TUBING CONNECTING 10 (TUBING) ×2 IMPLANT
TUBING INSUFFLATION 10FT LAP (TUBING) ×1 IMPLANT

## 2023-05-13 NOTE — Op Note (Signed)
 05/13/2023  4:00 PM  PATIENT:  Ashley Edwards  53 y.o. female  Patient Care Team: Rodrigo Ran, MD as PCP - General (Internal Medicine)  PRE-OPERATIVE DIAGNOSIS:  Terminal Ileum Mass  POST-OPERATIVE DIAGNOSIS:  Terminal Ileum Mass  PROCEDURE: XI ROBOT ASSISTED RIGHT COLECTOMY    Surgeon(s): Romie Levee, MD Almond Lint, MD  ASSISTANT: Dr Donell Beers   ANESTHESIA:   local and general  EBL: 30ml  Total I/O In: -  Out: 305 [Urine:275; Blood:30]  Delay start of Pharmacological VTE agent (>24hrs) due to surgical blood loss or risk of bleeding:  no  DRAINS: none   SPECIMEN:  Source of Specimen:  R colon   DISPOSITION OF SPECIMEN:  PATHOLOGY  COUNTS:  YES  PLAN OF CARE: Admit to inpatient   PATIENT DISPOSITION:  PACU - hemodynamically stable.  INDICATION:    53 y.o. F with terminal ileum mass that showed activity on Dotatate PETscan.  I recommended segmental resection:  The anatomy & physiology of the digestive tract was discussed.  The pathophysiology was discussed.  Natural history risks without surgery was discussed.   I worked to give an overview of the disease and the frequent need to have multispecialty involvement.  I feel the risks of no intervention will lead to serious problems that outweigh the operative risks; therefore, I recommended a partial colectomy to remove the pathology.  Laparoscopic & open techniques were discussed.   Risks such as bleeding, infection, abscess, leak, reoperation, possible ostomy, hernia, heart attack, death, and other risks were discussed.  I noted a good likelihood this will help address the problem.   Goals of post-operative recovery were discussed as well.    The patient expressed understanding & wished to proceed with surgery.  OR FINDINGS:   Patient had mass within the distal terminal ileum and a mass in the appendix  No obvious metastatic disease on visceral parietal peritoneum or liver.  DESCRIPTION:   Informed consent  was confirmed.  The patient underwent general anaesthesia without difficulty.  The patient was positioned appropriately.  VTE prevention in place.  The patient's abdomen was clipped, prepped, & draped in a sterile fashion.  Surgical timeout confirmed our plan.  The patient was positioned in reverse Trendelenburg.  Abdominal entry was gained using a Varies needle in the LUQ.  Entry was clean.  I induced carbon dioxide insufflation.  An 8mm robotic port was placed in the LUQ.  Camera inspection revealed no injury.  Extra ports were carefully placed under direct laparoscopic visualization.  I laparoscopically reflected the greater omentum and the upper abdomen the small bowel in the peilvis. The patient was appropriately positioned and the robot was docked to the patient's right side.  Robotic instruments were placed under direct visualization.    I began by evaluating the abdomen.  There was no sign of liver metastasis.  I ran the small bowel from IC valve to ligament of Tritz and no other masses could be noted in the mesentery or small bowel.  The appendix appeared thickened and slightly dilated.  I decided to perform a formal right colectomy so that I might evaluate nodal status.  I identified the ileocolic artery and vein within the mesentery. Dissection was bluntly carried around these structures. The duodenum was identified and free from the structures. I then separated the structures bluntly and used the robotic vessel sealer device to transect these.  I developed the retroperitoneal plane bluntly.  I then freed the appendix off its attachments to the  pelvic wall. I mobilized the terminal ileum.  I took care to avoid injuring any retroperitoneal structures.  After this I began to mobilize laterally down the white line of Toldt and then took down the hepatic flexure using the Enseal device. I mobilized the omentum off of the right transverse colon. The entire colon was then flipped medially and mobilized off  of the retroperitoneal structures until I could visualize the lateral edge of the duodenum underneath.  I gently freed the duodenal attachments.   I identified a portion of mesentery of the transverse colon just proximal to the right branch of the middle colic.  I divided up to the colon from the previous dissection of the mesentery using the robotic vessel sealer.  I then divided the terminal ileal mesentery in similar fashion.  At that point, the terminal ileum was divided with a blue load robotic 60 mm stapler.  The transverse colon was divided with a green load robotic stapler.  The specimen was then completely free and placed in the right upper quadrant.  Hemostasis was good.  I then oriented the remaining terminal ileum and transverse colon and a isoperistaltic fashion.  I placed an enterotomy in the small bowel and colon using the robotic scissors.  I then introduced a white load 60 mm robotic stapler into both enterotomies and created an anastomosis between the small bowel and transverse colon.  Hemostasis within the staple line was good.  The common enterotomy channel was closed using 2 running 2-0 V-Loc sutures.  The abdomen was then irrigated with normal saline.  Some bleeding was noted on the transection staple line of the transverse colon.  The omentum was then brought down over the anastomosis and secured.  At this point the robot was undocked.  The 12 mm suprapubic port was enlarged to a Pfannenstiel incision and an Alexis wound protector was placed.  The specimen was removed from the abdomen and evaluated.  Once the abdomen was inspected for hemostasis, the Alexis wound protector was removed.    The peritoneum of the Pfannenstiel incision was closed using a running 0 Vicryl suture.  The fascia was then closed using 2 #1 Novafil running sutures.  The subcutaneous tissue of the extraction incision was closed using a running 2-0 Vicryl suture. The skin was then closed using running subcuticular 4-0  Vicryl sutures.  A sterile dressing was applied.  The remaining port sites were closed using interrupted 4-0 Vicryl sutures and Dermabond.  All counts were correct per operating room staff. The patient was then awakened from anesthesia and sent to the post anesthesia care unit in stable condition.     Vanita Panda, MD  Colorectal and General Surgery [facility]

## 2023-05-13 NOTE — Anesthesia Preprocedure Evaluation (Addendum)
 Anesthesia Evaluation  Patient identified by MRN, date of birth, ID band Patient awake    Reviewed: Allergy & Precautions, H&P , NPO status , Patient's Chart, lab work & pertinent test results  History of Anesthesia Complications (+) PONV and history of anesthetic complications  Airway Mallampati: II  TM Distance: >3 FB Neck ROM: Full    Dental no notable dental hx.    Pulmonary neg pulmonary ROS   Pulmonary exam normal breath sounds clear to auscultation       Cardiovascular hypertension, Normal cardiovascular exam Rhythm:Regular Rate:Normal     Neuro/Psych negative neurological ROS  negative psych ROS   GI/Hepatic Neg liver ROS,GERD  ,, recent screening colonoscopy and was noted to have a bleeding 2 cm ileal mass   Endo/Other  negative endocrine ROS    Renal/GU Renal disease  negative genitourinary   Musculoskeletal negative musculoskeletal ROS (+)    Abdominal   Peds negative pediatric ROS (+)  Hematology negative hematology ROS (+)   Anesthesia Other Findings   Reproductive/Obstetrics negative OB ROS                             Anesthesia Physical Anesthesia Plan  ASA: 2  Anesthesia Plan: General   Post-op Pain Management: Tylenol PO (pre-op)*   Induction: Intravenous  PONV Risk Score and Plan: Ondansetron, Dexamethasone, Midazolam and Treatment may vary due to age or medical condition  Airway Management Planned: Oral ETT  Additional Equipment:   Intra-op Plan:   Post-operative Plan: Extubation in OR  Informed Consent: I have reviewed the patients History and Physical, chart, labs and discussed the procedure including the risks, benefits and alternatives for the proposed anesthesia with the patient or authorized representative who has indicated his/her understanding and acceptance.     Dental advisory given  Plan Discussed with: CRNA  Anesthesia Plan Comments:          Anesthesia Quick Evaluation

## 2023-05-13 NOTE — Anesthesia Procedure Notes (Signed)
 Procedure Name: Intubation Date/Time: 05/13/2023 2:01 PM  Performed by: Hulan Fess, CRNAPre-anesthesia Checklist: Patient identified, Emergency Drugs available, Suction available and Patient being monitored Patient Re-evaluated:Patient Re-evaluated prior to induction Oxygen Delivery Method: Circle System Utilized Preoxygenation: Pre-oxygenation with 100% oxygen Induction Type: IV induction Ventilation: Mask ventilation without difficulty Laryngoscope Size: Mac and 3 Grade View: Grade II Tube type: Oral Number of attempts: 1 Airway Equipment and Method: Stylet and Oral airway Placement Confirmation: ETT inserted through vocal cords under direct vision, positive ETCO2 and breath sounds checked- equal and bilateral Secured at: 22 cm Tube secured with: Tape Dental Injury: Teeth and Oropharynx as per pre-operative assessment

## 2023-05-13 NOTE — Interval H&P Note (Signed)
 History and Physical Interval Note:  05/13/2023 1:42 PM  Ashley Edwards  has presented today for surgery, with the diagnosis of CANCER.  The various methods of treatment have been discussed with the patient and family. After consideration of risks, benefits and other options for treatment, the patient has consented to  Procedure(s) with comments: XI ROBOT ASSISTED LAPAROSCOPIC PARTIAL COLECTOMY (N/A) - 180 as a surgical intervention.  The patient's history has been reviewed, patient examined, no change in status, stable for surgery.  I have reviewed the patient's chart and labs.  Questions were answered to the patient's satisfaction.     Vanita Panda, MD  Colorectal and General Surgery [facility]

## 2023-05-13 NOTE — Anesthesia Postprocedure Evaluation (Signed)
 Anesthesia Post Note  Patient: Ashley Edwards  Procedure(s) Performed: XI ROBOT ASSISTED LAPAROSCOPIC RIGHT COLECTOMY (Right)     Patient location during evaluation: PACU Anesthesia Type: General Level of consciousness: awake and alert Pain management: pain level controlled Vital Signs Assessment: post-procedure vital signs reviewed and stable Respiratory status: spontaneous breathing, nonlabored ventilation, respiratory function stable and patient connected to nasal cannula oxygen Cardiovascular status: blood pressure returned to baseline and stable Postop Assessment: no apparent nausea or vomiting Anesthetic complications: no   No notable events documented.  Last Vitals:  Vitals:   05/13/23 1730 05/13/23 1744  BP: 105/70 108/75  Pulse: 80 84  Resp: 13 11  Temp:    SpO2: 95% 96%    Last Pain:  Vitals:   05/13/23 1744  TempSrc:   PainSc: 3                  [place]

## 2023-05-13 NOTE — Transfer of Care (Signed)
 Immediate Anesthesia Transfer of Care Note  Patient: Ashley Edwards  Procedure(s) Performed: XI ROBOT ASSISTED LAPAROSCOPIC RIGHT COLECTOMY (Right)  Patient Location: PACU  Anesthesia Type:General  Level of Consciousness: awake, alert , and patient cooperative  Airway & Oxygen Therapy: Patient Spontanous Breathing and Patient connected to face mask oxygen  Post-op Assessment: Report given to RN and Post -op Vital signs reviewed and stable  Post vital signs: Reviewed and stable  Last Vitals:  Vitals Value Taken Time  BP 104/65 05/13/23 1612  Temp 97   Pulse 84 05/13/23 1615  Resp 12 05/13/23 1615  SpO2 100 % 05/13/23 1615  Vitals shown include unfiled device data.  Last Pain:  Vitals:   05/13/23 1128  TempSrc:   PainSc: 0-No pain         Complications: No notable events documented.

## 2023-05-14 LAB — BASIC METABOLIC PANEL
Anion gap: 11 (ref 5–15)
BUN: 11 mg/dL (ref 6–20)
CO2: 22 mmol/L (ref 22–32)
Calcium: 8.5 mg/dL — ABNORMAL LOW (ref 8.9–10.3)
Chloride: 102 mmol/L (ref 98–111)
Creatinine, Ser: 0.88 mg/dL (ref 0.44–1.00)
GFR, Estimated: >60 mL/min (ref >60)
Glucose, Bld: 132 mg/dL — ABNORMAL HIGH (ref 70–99)
Potassium: 4.3 mmol/L (ref 3.5–5.1)
Sodium: 135 mmol/L (ref 135–145)

## 2023-05-14 LAB — CBC
HCT: 41.9 % (ref 36.0–46.0)
Hemoglobin: 13.3 g/dL (ref 12.0–15.0)
MCH: 30.2 pg (ref 26.0–34.0)
MCHC: 31.7 g/dL (ref 30.0–36.0)
MCV: 95 fL (ref 80.0–100.0)
Platelets: 200 10*3/uL (ref 150–400)
RBC: 4.41 MIL/uL (ref 3.87–5.11)
RDW: 12.8 % (ref 11.5–15.5)
WBC: 14.7 10*3/uL — ABNORMAL HIGH (ref 4.0–10.5)
nRBC: 0 % (ref 0.0–0.2)

## 2023-05-14 MED ORDER — OXYCODONE HCL 5 MG PO TABS
5.0000 mg | ORAL_TABLET | ORAL | Status: DC | PRN
  Administered 2023-05-14: 10 mg via ORAL
  Administered 2023-05-14: 5 mg via ORAL
  Administered 2023-05-14: 10 mg via ORAL
  Filled 2023-05-14: qty 2
  Filled 2023-05-14: qty 1
  Filled 2023-05-14: qty 2

## 2023-05-14 MED ORDER — TRAMADOL HCL 50 MG PO TABS: 50.0000 mg | ORAL_TABLET | Freq: Four times a day (QID) | ORAL | Status: DC | PRN

## 2023-05-14 NOTE — Progress Notes (Signed)
 Flecks of stool in toilet. Patient endorsing more gas.

## 2023-05-14 NOTE — Progress Notes (Signed)
 1 Day Post-Op Robotic R colectomy Subjective: Doing well, feels sore, tolerating liquids, no bowel function yet  Objective: Vital signs in last 24 hours: Temp:  [98 F (36.7 C)-99.3 F (37.4 C)] 98.4 F (36.9 C) (02/28 0458) Pulse Rate:  [58-85] 64 (02/28 0458) Resp:  [10-18] 18 (02/28 0458) BP: (105-141)/(70-89) 117/75 (02/28 0458) SpO2:  [95 %-100 %] 100 % (02/28 0458) Weight:  [75.8 kg] 75.8 kg (02/27 1128)   Intake/Output from previous day: 02/27 0701 - 02/28 0700 In: 2240 [P.O.:840; I.V.:1400] Out: 1605 [Urine:1575; Blood:30] Intake/Output this shift: No intake/output data recorded.   General appearance: alert and cooperative GI: soft, non-distended  Incision: no significant drainage  Lab Results:  Recent Labs    05/14/23 0429  WBC 14.7*  HGB 13.3  HCT 41.9  PLT 200   BMET Recent Labs    05/14/23 0710  NA 135  K 4.3  CL 102  CO2 22  GLUCOSE 132*  BUN 11  CREATININE 0.88  CALCIUM 8.5*   PT/INR No results for input(s): "LABPROT", "INR" in the last 72 hours. ABG No results for input(s): "PHART", "HCO3" in the last 72 hours.  Invalid input(s): "PCO2", "PO2"  MEDS, Scheduled  acetaminophen  1,000 mg Oral Q6H   alvimopan  12 mg Oral BID   atorvastatin  10 mg Oral Daily   diltiazem  120 mg Oral QPM   enoxaparin (LOVENOX) injection  40 mg Subcutaneous Q24H   escitalopram  20 mg Oral QHS   feeding supplement  237 mL Oral BID BM   gabapentin  300 mg Oral BID   irbesartan  75 mg Oral Daily   ramelteon  8 mg Oral QHS   saccharomyces boulardii  250 mg Oral BID    Studies/Results: No results found.  Assessment: s/p Procedure(s): XI ROBOT ASSISTED LAPAROSCOPIC RIGHT COLECTOMY Patient Active Problem List   Diagnosis Date Noted   Carcinoid tumor 05/13/2023   Metabolic syndrome 02/23/2020   Endometriosis 01/29/2020   Hyperlipemia 01/10/2018   Pelvic pain in female 11/03/2012    Expected post op course  Plan: d/c foley Advance diet as  tolerated Ambulate in hall   LOS: 1 day     .Vanita Panda, MD [facility], [place]    05/14/2023 9:16 AM

## 2023-05-14 NOTE — Progress Notes (Signed)
 Pt tolerating full liquid diet. Ambulating in room and hall. IS to 2500. Voided after Foley removal. Thinks she might be starting to pass gas.

## 2023-05-14 NOTE — Plan of Care (Signed)

## 2023-05-15 LAB — CBC
HCT: 35.3 % — ABNORMAL LOW (ref 36.0–46.0)
Hemoglobin: 11 g/dL — ABNORMAL LOW (ref 12.0–15.0)
MCH: 30.1 pg (ref 26.0–34.0)
MCHC: 31.2 g/dL (ref 30.0–36.0)
MCV: 96.4 fL (ref 80.0–100.0)
Platelets: 210 10*3/uL (ref 150–400)
RBC: 3.66 MIL/uL — ABNORMAL LOW (ref 3.87–5.11)
RDW: 12.8 % (ref 11.5–15.5)
WBC: 11.7 10*3/uL — ABNORMAL HIGH (ref 4.0–10.5)
nRBC: 0 % (ref 0.0–0.2)

## 2023-05-15 LAB — BASIC METABOLIC PANEL
Anion gap: 6 (ref 5–15)
BUN: 16 mg/dL (ref 6–20)
CO2: 30 mmol/L (ref 22–32)
Calcium: 8.2 mg/dL — ABNORMAL LOW (ref 8.9–10.3)
Chloride: 104 mmol/L (ref 98–111)
Creatinine, Ser: 0.78 mg/dL (ref 0.44–1.00)
GFR, Estimated: >60 mL/min (ref >60)
Glucose, Bld: 104 mg/dL — ABNORMAL HIGH (ref 70–99)
Potassium: 3.9 mmol/L (ref 3.5–5.1)
Sodium: 140 mmol/L (ref 135–145)

## 2023-05-15 MED ORDER — ZEPBOUND 12.5 MG/0.5ML ~~LOC~~ SOAJ: 12.5000 mg | SUBCUTANEOUS | Status: DC

## 2023-05-15 MED ORDER — TRAMADOL HCL 50 MG PO TABS
50.0000 mg | ORAL_TABLET | Freq: Four times a day (QID) | ORAL | 0 refills | Status: DC | PRN
Start: 2023-05-15 — End: 2023-06-07

## 2023-05-15 NOTE — Progress Notes (Signed)
 AVS reviewed w/ pt & husband - both verbalized an understanding. PIV x 2 removed as noted. Pt dressed for d/c to home. Pt to lobby via w/c  - home w/ husband

## 2023-05-15 NOTE — TOC Initial Note (Signed)
 Transition of Care [facility]) - Initial/Assessment Note    Patient Details  Name: Ashley Edwards MRN: [id number] Date of Birth: 02-15-1971  [facility]) CM/SW Contact:    Adrian Prows, RN Phone Number: 05/15/2023, 9:25 AM  Clinical Narrative:                 Spoke w/ pt and husband Victorio Palm in room; pt says she lives at home and she plans to return at d/c; her husband will provide transportation; pt verified insurance/pCP; she denies SDOH risks; pt says she does not have DME, HH services, or home oxygen; no TOC needs.  Expected Discharge Plan: Home/Self Care Barriers to Discharge: No Barriers Identified   Patient Goals and CMS Choice Patient states their goals for this hospitalization and ongoing recovery are:: home          Expected Discharge Plan and Services   Discharge Planning Services: CM Consult Post Acute Care Choice: NA Living arrangements for the past 2 months: Single Family Home Expected Discharge Date: 05/15/23               DME Arranged: N/A DME Agency: NA       HH Arranged: NA HH Agency: NA        Prior Living Arrangements/Services Living arrangements for the past 2 months: Single Family Home Lives with:: Spouse Patient language and need for interpreter reviewed:: Yes Do you feel safe going back to the place where you live?: Yes      Need for Family Participation in Patient Care: Yes (Comment) Care giver support system in place?: Yes (comment) Current home services:  (n/a) Criminal Activity/Legal Involvement Pertinent to Current Situation/Hospitalization: No - Comment as needed  Activities of Daily Living   ADL Screening (condition at time of admission) Independently performs ADLs?: Yes (appropriate for developmental age) Is the patient deaf or have difficulty hearing?: No Does the patient have difficulty seeing, even when wearing glasses/contacts?: No Does the patient have difficulty concentrating, remembering, or making  decisions?: No  Permission Sought/Granted Permission sought to share information with : Case Manager Permission granted to share information with : Yes, Verbal Permission Granted  Share Information with NAME: Case Manager     Permission granted to share info w Relationship: Marcha Licklider (spouse) [phone]     Emotional Assessment Appearance:: Appears stated age Attitude/Demeanor/Rapport: Gracious Affect (typically observed): Accepting Orientation: : Oriented to Self, Oriented to Place, Oriented to  Time, Oriented to Situation   Psych Involvement: No (comment)  Admission diagnosis:  Carcinoid tumor [D3A.00] Patient Active Problem List   Diagnosis Date Noted   Carcinoid tumor 05/13/2023   Metabolic syndrome 02/23/2020   Endometriosis 01/29/2020   Hyperlipemia 01/10/2018   Pelvic pain in female 11/03/2012   PCP:  Rodrigo Ran, MD Pharmacy:   [facility] [address] [place] [address] Phone: [phone] Fax: [phone]     Social Drivers of Health (SDOH) Social History: SDOH Screenings   Food Insecurity: No Food Insecurity (05/15/2023)  Housing: Low Risk  (05/15/2023)  Transportation Needs: No Transportation Needs (05/15/2023)  Utilities: Not At Risk (05/15/2023)  Tobacco Use: Low Risk  (05/13/2023)   SDOH Interventions: Food Insecurity Interventions: Intervention Not Indicated, Inpatient TOC Housing Interventions: Intervention Not Indicated, Inpatient TOC Transportation Interventions: Intervention Not Indicated, Inpatient TOC Utilities Interventions: Intervention Not Indicated, Inpatient TOC   Readmission Risk Interventions    05/14/2023   10:52 AM  Readmission Risk Prevention Plan  Post Dischage Appt Complete  Medication Screening Complete  Transportation Screening Complete

## 2023-05-15 NOTE — Discharge Instructions (Signed)

## 2023-05-15 NOTE — Plan of Care (Signed)
  Problem: Education: Goal: Understanding of discharge needs will improve Outcome: Progressing   Problem: Education: Goal: Verbalization of understanding of the causes of altered bowel function will improve Outcome: Progressing   Problem: Activity: Goal: Ability to tolerate increased activity will improve Outcome: Progressing   Problem: Bowel/Gastric: Goal: Gastrointestinal status for postoperative course will improve Outcome: Progressing

## 2023-05-15 NOTE — Discharge Summary (Signed)
 Physician Discharge Summary  Patient ID: Ashley Edwards MRN: [id number] DOB/AGE: Dec 24, 1970 53 y.o.  Admit date: 05/13/2023 Discharge date: 05/15/2023  Admission Diagnoses: Carcinoid tumor  Discharge Diagnoses:  Principal Problem:   Carcinoid tumor   Discharged Condition: good  Hospital Course: Patient was admitted to the med surg floor after surgery.  Diet was advanced as tolerated.  Patient began to have bowel function on postop day 1.  By postop day 2, she was tolerating a solid diet and pain was controlled with oral medications.  She was urinating without difficulty and ambulating without assistance.  Patient was felt to be in stable condition for discharge to home.   Consults: None  Significant Diagnostic Studies: labs: cbc, bmet  Treatments: IV hydration, analgesia: acetaminophen, and surgery: robotic R colectomy  Discharge Exam: Blood pressure (!) 159/81, pulse 87, temperature 97.8 F (36.6 C), temperature source Oral, resp. rate 20, height 5' 7.5" (1.715 m), weight 75.8 kg, last menstrual period 04/25/2019, SpO2 98%. General appearance: alert and cooperative GI: soft, non-distended Incision: clean, dry, intact  Disposition: Discharge disposition: 01-Home or Self Care        Allergies as of 05/15/2023       Reactions   Dilaudid [hydromorphone] Anaphylaxis, Swelling   Can take Percocet        Medication List     STOP taking these medications    metroNIDAZOLE 500 MG tablet Commonly known as: FLAGYL   neomycin 500 MG tablet Commonly known as: MYCIFRADIN       TAKE these medications    atorvastatin 10 MG tablet Commonly known as: LIPITOR TAKE 1 TABLET BY MOUTH ONCE A DAY What changed: when to take this   bisacodyl 5 MG EC tablet Generic drug: bisacodyl Take 4 tablets (20 mg total) by mouth once daily as needed for constipation for up to 1 dose as directed.   diltiazem 120 MG 24 hr capsule Commonly known as: CARDIZEM CD Take 1 capsule (120 mg  total) by mouth every evening.   escitalopram 20 MG tablet Commonly known as: Lexapro Take 1 tablet (20 mg total) by mouth daily. What changed: when to take this   NON FORMULARY Inject 1 Dose into the vein every 30 (thirty) days. Myers Cocktail IV Therapy (magnesium/vitamin c/glutathione/calcium/b complex)   ondansetron 4 MG tablet Commonly known as: ZOFRAN Take 1 tablet (4 mg) by mouth 2 (two) times a day.  Take 1 dose at 3pm and then 1 dose at 10pm.   polyethylene glycol powder 17 GM/SCOOP powder Commonly known as: GLYCOLAX/MIRALAX Mix 238 g in liquid & take by mouth once for 1 dose, according to your procedure prep instructions.   ramelteon 8 MG tablet Commonly known as: ROZEREM Take 1 tablet (8 mg total) by mouth at bedtime.   telmisartan 20 MG tablet Commonly known as: MICARDIS Take 1 tablet (20 mg total) by mouth every evening.   traMADol 50 MG tablet Commonly known as: ULTRAM Take 1-2 tablets (50-100 mg total) by mouth every 6 (six) hours as needed (pain).   Zepbound 12.5 MG/0.5ML Pen Generic drug: tirzepatide Inject 12.5 mg into the skin once a week. Do not restart until 3/20 Start taking on: June 03, 2023 What changed:  additional instructions These instructions start on June 03, 2023. If you are unsure what to do until then, ask your doctor or other care provider.        Follow-up Information     Romie Levee, MD. Schedule an appointment as soon as  possible for a visit in 2 week(s).   Specialties: General Surgery, Colon and Rectal Surgery Contact information: [address] [place] [address] [phone]                 Signed: Vanita Panda 05/15/2023, 8:37 AM

## 2023-05-18 LAB — SURGICAL PATHOLOGY

## 2023-05-19 ENCOUNTER — Encounter: Payer: Self-pay | Admitting: *Deleted

## 2023-05-19 ENCOUNTER — Other Ambulatory Visit: Payer: Self-pay | Admitting: *Deleted

## 2023-05-19 DIAGNOSIS — C7A012 Malignant carcinoid tumor of the ileum: Secondary | ICD-10-CM

## 2023-05-19 NOTE — Progress Notes (Signed)
 PATIENT NAVIGATOR PROGRESS NOTE  Name: CRYSTEN KAMAN Date: 05/19/2023 MRN: [id number]  DOB: July 01, 1970   Reason for visit:  New Patient appt  Comments:  Called and spoke with Ms Peach and have her scheduled with Dr Truett Perna on 06/07/23 at 1:40.  Reviewed building directions and parking. Given contact number to call with any questions or concerns    Time spent counseling/coordinating care: 30-45 minutes

## 2023-05-19 NOTE — Progress Notes (Signed)
 Referral entered and patient scheduled

## 2023-05-24 ENCOUNTER — Other Ambulatory Visit (HOSPITAL_COMMUNITY): Payer: Self-pay

## 2023-05-24 MED ORDER — DILTIAZEM HCL ER COATED BEADS 120 MG PO CP24
120.0000 mg | ORAL_CAPSULE | Freq: Every evening | ORAL | 4 refills | Status: AC
  Filled 2023-05-24: qty 30, 30d supply, fill #0
  Filled 2023-06-28: qty 30, 30d supply, fill #1
  Filled 2023-08-03: qty 30, 30d supply, fill #2
  Filled 2023-09-10: qty 30, 30d supply, fill #3
  Filled 2023-10-11: qty 30, 30d supply, fill #4
  Filled 2023-11-09: qty 30, 30d supply, fill #5
  Filled 2023-12-06: qty 30, 30d supply, fill #6
  Filled 2024-01-07: qty 30, 30d supply, fill #7
  Filled 2024-02-05 – 2024-02-17 (×2): qty 30, 30d supply, fill #8
  Filled 2024-03-20: qty 30, 30d supply, fill #9
  Filled 2024-04-15: qty 30, 30d supply, fill #10

## 2023-05-26 ENCOUNTER — Other Ambulatory Visit: Payer: Self-pay

## 2023-05-27 NOTE — Progress Notes (Signed)
 The proposed treatment discussed in conference is for discussion purpose only and is not a binding recommendation.  The patients have not been physically examined, or presented with their treatment options.  Therefore, final treatment plans cannot be decided.

## 2023-05-28 ENCOUNTER — Other Ambulatory Visit (HOSPITAL_COMMUNITY): Payer: Self-pay

## 2023-06-07 ENCOUNTER — Inpatient Hospital Stay: Attending: Oncology | Admitting: Oncology

## 2023-06-07 VITALS — BP 123/97 | HR 71 | Temp 98.2°F | Resp 18 | Ht 67.5 in | Wt 172.0 lb

## 2023-06-07 DIAGNOSIS — D3A012 Benign carcinoid tumor of the ileum: Secondary | ICD-10-CM | POA: Insufficient documentation

## 2023-06-07 DIAGNOSIS — C7A012 Malignant carcinoid tumor of the ileum: Secondary | ICD-10-CM

## 2023-06-07 NOTE — Progress Notes (Signed)
 [facility] New Patient Consult   Requesting MD: Beverley Fiedler, Md [address] [place],  [place] [address]   Ashley Edwards 53 y.o.  [birth year]    Reason for Consult: Carcinoid tumor   HPI: Ashley Edwards has a history of an adenomatous polyp.  She underwent a screening colonoscopy 04/12/2023.  A polypoid nodule was noted at the terminal ileum measuring 20 mm.  A biopsy was obtained.  The pathology revealed benign ileal mucosa with no diagnostic abnormality. She was referred for a diagnostic dotatate PET on 04/23/2023.  A single focus of activity localized to the terminal ileum with a subtle density in the terminal ileum on CT.  Review of a CT from 01/08/2023 reveals a subtle high density focus in the terminal ileum at the same site.  No abnormal or radiotracer avid mesenteric nodes.  No abnormal activity in the liver. She was referred to Dr. Maisie Fus and was taken to a robotic assisted right colectomy on 05/13/2023.  A mass was noted in the distal terminal ileum and in the appendix.  No obvious metastatic disease. The pathology revealed a low-grade neuroendocrine tumor of the terminal ileum measuring 0.8 cm.  Metastatic carcinoma was noted in 2 of 17 lymph nodes.  She has recovered from surgery.  No difficulty with bowel function.  She felt completely well prior to surgery.  Past Medical History:  Diagnosis Date   Endometriosis    GERD (gastroesophageal reflux disease)    History of kidney stones    Hyperlipemia    Hypertension    Kidney stones    Microalbuminuria    MRSA infection    +prior to neck surgery   PONV (postoperative nausea and vomiting)     .  G4 P3, 1 miscarriage, LMP 1 year ago  Past Surgical History:  Procedure Laterality Date   BREAST REDUCTION SURGERY  2016   CERVICAL DISCECTOMY     COLONOSCOPY     DIAGNOSTIC LAPAROSCOPY Left    left ovary removed   DILATION AND CURETTAGE OF UTERUS     EXTRACORPOREAL SHOCK WAVE LITHOTRIPSY Left 09/02/2017    Procedure: LEFT EXTRACORPOREAL SHOCK WAVE LITHOTRIPSY (ESWL);  Surgeon: Jerilee Field, MD;  Location: [facility];  Service: Urology;  Laterality: Left;   EXTRACORPOREAL SHOCK WAVE LITHOTRIPSY Left 06/26/2019   Procedure: EXTRACORPOREAL SHOCK WAVE LITHOTRIPSY (ESWL);  Surgeon: Jerilee Field, MD;  Location: [facility];  Service: Urology;  Laterality: Left;   EXTRACORPOREAL SHOCK WAVE LITHOTRIPSY Right 05/02/2020   Procedure: EXTRACORPOREAL SHOCK WAVE LITHOTRIPSY (ESWL);  Surgeon: Crist Fat, MD;  Location: [facility];  Service: Urology;  Laterality: Right;   REDUCTION MAMMAPLASTY       .  Left oophorectomy for benign cyst in her 30s   .  Resection of endometriosis from the pubic bone Medications: Reviewed  Allergies:  Allergies  Allergen Reactions   Dilaudid [Hydromorphone] Anaphylaxis and Swelling    Can take Percocet    Family history: No family history of cancer  Social History:   She lives with her husband and children in [place].  She previously worked as a Teacher, early years/pre.  She does not use cigarettes.  Social alcohol use.  No transfusion history.  No risk factor for HIV or hepatitis.  ROS:   Positives include: 20 pound weight loss and anorexia for the past year since starting tirzepatide  A complete ROS was otherwise negative.  Physical Exam:  Blood pressure (!) 123/97, pulse 71, temperature 98.2  F (36.8 C), temperature source Temporal, resp. rate 18, height 5' 7.5" (1.715 m), weight 172 lb (78 kg), last menstrual period 04/25/2019, SpO2 100%.  HEENT: Oropharynx without visible mass, neck without mass Lungs: Clear bilaterally Cardiac: Regular rate and rhythm Abdomen: Nontender, no mass, no hepatosplenomegaly, healed surgical incisions  Vascular: No leg edema Lymph nodes: No cervical, supraclavicular, axillary, or inguinal nodes Neurologic: Alert and oriented, the motor exam appears intact in the upper and lower extremities  bilaterally Skin: No rash Musculoskeletal: No spine tenderness   LAB:  CBC  Lab Results  Component Value Date   WBC 11.7 (H) 05/15/2023   HGB 11.0 (L) 05/15/2023   HCT 35.3 (L) 05/15/2023   MCV 96.4 05/15/2023   PLT 210 05/15/2023   NEUTROABS 3.2 08/29/2009        CMP  Lab Results  Component Value Date   NA 140 05/15/2023   K 3.9 05/15/2023   CL 104 05/15/2023   CO2 30 05/15/2023   GLUCOSE 104 (H) 05/15/2023   BUN 16 05/15/2023   CREATININE 0.78 05/15/2023   CALCIUM 8.2 (L) 05/15/2023   PROT 7.1 01/29/2020   AST 23 01/29/2020   ALT 23 01/29/2020   BILITOT 0.5 01/29/2020   GFRNONAA >60 05/15/2023   GFRAA >90 12/25/2011     No results found for: "CEA1", "SWF093", "CA125"  Imaging: As per HPI, dotatate PET 04/23/2023 reviewed with Ashley Edwards and her husband   Assessment/Plan:   Carcinoid tumor of the terminal ileum, stage III (pT2 pN1) Right colectomy 05/13/2023, tumor invades the muscularis propria, negative resection margins, 2/17 nodes positive, grade 1, mitotic rate 0, no lymphovascular invasion 04/23/2023 type PET: Single focus of radiotracer activity at the terminal ileum, no evidence of metastatic disease, subtle density in the terminal ileum measuring 8 mm (subtle high density focus in the terminal ileum on CT 01/08/2023) Tubular adenoma of the colon 04/05/2020 History of kidney stones Endometriosis Hypertension   Disposition:   Ashley Edwards has been diagnosed with a low-grade neuroendocrine tumor (carcinoid) of the terminal ileum.  The tumor was discovered incidentally at the time of a surveillance colonoscopy.  She was asymptomatic from the carcinoid tumor.  Ashley Edwards has a good prognosis for a long-term disease specific survival.  She saw Dr. Lattie Corns 06/01/2023.  He recommends observation.  I agree.  She will be referred for surveillance CT imaging in February 2026.  There was no baseline chromogranin a level.  She takes Prilosec.  I do not recommend  tumor marker testing at present.  Ashley Edwards will return for an office visit after the CT in February.  She will call in the interim for new symptoms.  We will request pathology evaluation on the resected appendix.  Thornton Papas, MD  06/07/2023, 5:00 PM

## 2023-06-15 ENCOUNTER — Other Ambulatory Visit: Payer: Self-pay | Admitting: Obstetrics & Gynecology

## 2023-06-15 ENCOUNTER — Other Ambulatory Visit (HOSPITAL_COMMUNITY): Payer: Self-pay

## 2023-06-15 MED ORDER — ZEPBOUND 12.5 MG/0.5ML ~~LOC~~ SOAJ
12.5000 mg | SUBCUTANEOUS | 2 refills | Status: DC
  Filled 2023-06-15: qty 2, 28d supply, fill #0
  Filled 2023-07-12: qty 2, 28d supply, fill #1
  Filled 2023-08-15: qty 2, 28d supply, fill #2

## 2023-06-28 ENCOUNTER — Encounter: Payer: Self-pay | Admitting: Obstetrics & Gynecology

## 2023-06-29 ENCOUNTER — Other Ambulatory Visit (HOSPITAL_COMMUNITY): Payer: Self-pay

## 2023-07-02 ENCOUNTER — Other Ambulatory Visit: Payer: Self-pay

## 2023-07-07 ENCOUNTER — Other Ambulatory Visit (HOSPITAL_COMMUNITY): Payer: Self-pay

## 2023-07-09 ENCOUNTER — Other Ambulatory Visit (HOSPITAL_COMMUNITY): Payer: Self-pay

## 2023-07-12 ENCOUNTER — Other Ambulatory Visit (HOSPITAL_COMMUNITY): Payer: Self-pay

## 2023-07-12 ENCOUNTER — Other Ambulatory Visit: Payer: Self-pay | Admitting: Obstetrics & Gynecology

## 2023-07-12 ENCOUNTER — Encounter: Payer: Self-pay | Admitting: Obstetrics & Gynecology

## 2023-07-12 ENCOUNTER — Ambulatory Visit (INDEPENDENT_AMBULATORY_CARE_PROVIDER_SITE_OTHER): Admitting: Obstetrics & Gynecology

## 2023-07-12 VITALS — BP 133/93 | HR 75 | Ht 67.0 in | Wt 165.0 lb

## 2023-07-12 DIAGNOSIS — Z01419 Encounter for gynecological examination (general) (routine) without abnormal findings: Secondary | ICD-10-CM | POA: Diagnosis not present

## 2023-07-12 DIAGNOSIS — Z1231 Encounter for screening mammogram for malignant neoplasm of breast: Secondary | ICD-10-CM

## 2023-07-12 NOTE — Progress Notes (Unsigned)
 Last pap smear (date and result):05/12/2021 WNL Last mammogram (date and result):07/01/2022 WNL Last colon screening (date and result):Right partial colectomy 05/13/2023 Brush:yes Floss:yes Seatbelts: yes Sunscreen: yes  Evelia Hipp, RN

## 2023-07-12 NOTE — Progress Notes (Unsigned)
 Subjective:     Ashley Edwards is a 53 y.o. female here for a routine exam.  Current complaints: Recently diagnosed with neuroendocrine tumor and underwent right colectomy.    Gynecologic History Patient's last menstrual period was 04/25/2019. Contraception: post menopausal status Last Pap: 2023. Results were: normal Last mammogram: 4/24. Results were: normal  Obstetric History OB History  Gravida Para Term Preterm AB Living  4 3 3  1 3   SAB IAB Ectopic Multiple Live Births  1        # Outcome Date GA Lbr Len/2nd Weight Sex Type Anes PTL Lv  4 SAB           3 Term           2 Term           1 Term              The following portions of the patient's history were reviewed and updated as appropriate: allergies, current medications, past family history, past medical history, past social history, past surgical history, and problem list.  Review of Systems Pertinent items noted in HPI and remainder of comprehensive ROS otherwise negative.    Objective:   Vitals:   07/12/23 0808 07/12/23 0816  BP: (!) 142/100 (!) 133/93  Pulse: 75   Weight: 165 lb (74.8 kg)   Height: 5\' 7"  (1.702 m)     Vitals:  WNL General appearance: alert, cooperative and no distress  HEENT: Normocephalic, without obvious abnormality, atraumatic Eyes: negative Throat: lips, mucosa, and tongue normal; teeth and gums normal  Respiratory: Clear to auscultation bilaterally  CV: Regular rate and rhythm  Breasts:  Normal appearance, no masses or tenderness, no nipple retraction or dimpling  GI: Soft, non-tender; bowel sounds normal; no masses,  no organomegaly  GU: External Genitalia:  Tanner V, no lesion Urethra:  No prolapse   Vagina: Pink, normal rugae, no blood or discharge  Cervix: No CMT, no lesion  Uterus:  Normal size and contour, non tender  Adnexa: Normal, no masses, non tender  Musculoskeletal: No edema, redness or tenderness in the calves or thighs  Skin: No lesions or rash  Lymphatic:  Axillary adenopathy: none     Psychiatric: Normal mood and behavior        Assessment:    Healthy female exam.    Plan:    Pap up to date Needs mammogram in 2025 Colon surveillance by oncologist and GI MD

## 2023-07-26 ENCOUNTER — Encounter (HOSPITAL_COMMUNITY): Payer: Self-pay

## 2023-08-01 ENCOUNTER — Other Ambulatory Visit: Payer: Self-pay | Admitting: Obstetrics & Gynecology

## 2023-08-01 ENCOUNTER — Other Ambulatory Visit (HOSPITAL_COMMUNITY): Payer: Self-pay

## 2023-08-01 DIAGNOSIS — F419 Anxiety disorder, unspecified: Secondary | ICD-10-CM

## 2023-08-02 ENCOUNTER — Encounter (HOSPITAL_BASED_OUTPATIENT_CLINIC_OR_DEPARTMENT_OTHER): Payer: Self-pay | Admitting: Radiology

## 2023-08-02 ENCOUNTER — Ambulatory Visit (HOSPITAL_BASED_OUTPATIENT_CLINIC_OR_DEPARTMENT_OTHER)
Admission: RE | Admit: 2023-08-02 | Discharge: 2023-08-02 | Disposition: A | Discharge status: Home / Self Care | Source: Ambulatory Visit | Attending: Obstetrics & Gynecology | Admitting: Obstetrics & Gynecology

## 2023-08-02 ENCOUNTER — Other Ambulatory Visit (HOSPITAL_COMMUNITY): Payer: Self-pay

## 2023-08-02 DIAGNOSIS — Z1231 Encounter for screening mammogram for malignant neoplasm of breast: Secondary | ICD-10-CM | POA: Diagnosis present

## 2023-08-02 MED ORDER — TELMISARTAN 20 MG PO TABS
20.0000 mg | ORAL_TABLET | Freq: Every evening | ORAL | 4 refills | Status: AC
  Filled 2023-08-02 – 2023-09-21 (×2): qty 30, 30d supply, fill #0
  Filled 2023-10-26: qty 30, 30d supply, fill #1
  Filled 2023-12-06: qty 30, 30d supply, fill #2
  Filled 2024-01-07: qty 30, 30d supply, fill #3
  Filled 2024-02-05 – 2024-02-17 (×2): qty 30, 30d supply, fill #4
  Filled 2024-03-20: qty 30, 30d supply, fill #5
  Filled 2024-04-15: qty 30, 30d supply, fill #6

## 2023-08-03 ENCOUNTER — Other Ambulatory Visit (HOSPITAL_COMMUNITY): Payer: Self-pay

## 2023-08-04 ENCOUNTER — Other Ambulatory Visit: Payer: Self-pay

## 2023-08-04 ENCOUNTER — Other Ambulatory Visit (HOSPITAL_COMMUNITY): Payer: Self-pay

## 2023-08-05 ENCOUNTER — Other Ambulatory Visit: Payer: Self-pay | Admitting: Obstetrics & Gynecology

## 2023-08-05 ENCOUNTER — Other Ambulatory Visit: Payer: Self-pay

## 2023-08-05 ENCOUNTER — Other Ambulatory Visit (HOSPITAL_COMMUNITY): Payer: Self-pay

## 2023-08-05 ENCOUNTER — Encounter: Payer: Self-pay | Admitting: Pharmacist

## 2023-08-05 ENCOUNTER — Ambulatory Visit: Payer: Self-pay | Admitting: Obstetrics & Gynecology

## 2023-08-10 ENCOUNTER — Other Ambulatory Visit: Payer: Self-pay | Admitting: Obstetrics & Gynecology

## 2023-08-10 ENCOUNTER — Other Ambulatory Visit (HOSPITAL_COMMUNITY): Payer: Self-pay

## 2023-08-10 DIAGNOSIS — F419 Anxiety disorder, unspecified: Secondary | ICD-10-CM

## 2023-08-11 ENCOUNTER — Other Ambulatory Visit (HOSPITAL_COMMUNITY): Payer: Self-pay

## 2023-08-16 ENCOUNTER — Other Ambulatory Visit (HOSPITAL_BASED_OUTPATIENT_CLINIC_OR_DEPARTMENT_OTHER): Payer: Self-pay

## 2023-08-16 ENCOUNTER — Other Ambulatory Visit (HOSPITAL_COMMUNITY): Payer: Self-pay

## 2023-08-16 ENCOUNTER — Other Ambulatory Visit: Payer: Self-pay

## 2023-08-19 ENCOUNTER — Other Ambulatory Visit (HOSPITAL_COMMUNITY): Payer: Self-pay

## 2023-08-19 ENCOUNTER — Other Ambulatory Visit: Payer: Self-pay | Admitting: Obstetrics & Gynecology

## 2023-08-19 DIAGNOSIS — F419 Anxiety disorder, unspecified: Secondary | ICD-10-CM

## 2023-08-23 ENCOUNTER — Other Ambulatory Visit: Payer: Self-pay | Admitting: Obstetrics & Gynecology

## 2023-08-23 ENCOUNTER — Other Ambulatory Visit (HOSPITAL_COMMUNITY): Payer: Self-pay

## 2023-08-23 DIAGNOSIS — F419 Anxiety disorder, unspecified: Secondary | ICD-10-CM

## 2023-08-23 MED ORDER — ZEPBOUND 15 MG/0.5ML ~~LOC~~ SOAJ
15.0000 mg | SUBCUTANEOUS | 6 refills | Status: AC
  Filled 2023-08-23 – 2023-08-24 (×2): qty 2, 28d supply, fill #0
  Filled 2023-09-21: qty 2, 28d supply, fill #1
  Filled 2023-10-26: qty 2, 28d supply, fill #2
  Filled 2023-12-06: qty 2, 28d supply, fill #3
  Filled 2024-01-07: qty 2, 28d supply, fill #4
  Filled 2024-02-05 – 2024-02-17 (×2): qty 2, 28d supply, fill #5
  Filled 2024-03-20: qty 2, 28d supply, fill #6

## 2023-08-23 MED ORDER — ESCITALOPRAM OXALATE 20 MG PO TABS
20.0000 mg | ORAL_TABLET | Freq: Every day | ORAL | 12 refills | Status: AC
  Filled 2023-08-23: qty 30, 30d supply, fill #0
  Filled 2023-09-21: qty 30, 30d supply, fill #1
  Filled 2023-10-26: qty 30, 30d supply, fill #2
  Filled 2023-12-06: qty 30, 30d supply, fill #3
  Filled 2024-01-07: qty 30, 30d supply, fill #4
  Filled 2024-02-05 – 2024-02-17 (×2): qty 30, 30d supply, fill #5
  Filled 2024-03-20: qty 30, 30d supply, fill #6
  Filled 2024-04-15: qty 30, 30d supply, fill #7

## 2023-08-23 NOTE — Progress Notes (Signed)
 Ashley Edwards feels like she has hit a plateau and would like to increase to 15 mg.

## 2023-08-24 ENCOUNTER — Other Ambulatory Visit: Payer: Self-pay

## 2023-08-24 ENCOUNTER — Other Ambulatory Visit (HOSPITAL_COMMUNITY): Payer: Self-pay

## 2023-08-26 ENCOUNTER — Other Ambulatory Visit (HOSPITAL_COMMUNITY): Payer: Self-pay

## 2023-09-02 ENCOUNTER — Other Ambulatory Visit (HOSPITAL_COMMUNITY): Payer: Self-pay

## 2023-09-21 ENCOUNTER — Encounter: Payer: Self-pay | Admitting: Obstetrics & Gynecology

## 2023-09-22 ENCOUNTER — Other Ambulatory Visit: Payer: Self-pay

## 2023-09-22 ENCOUNTER — Other Ambulatory Visit (HOSPITAL_COMMUNITY): Payer: Self-pay

## 2023-09-27 ENCOUNTER — Other Ambulatory Visit (HOSPITAL_COMMUNITY): Payer: Self-pay

## 2023-10-05 ENCOUNTER — Other Ambulatory Visit (HOSPITAL_COMMUNITY): Payer: Self-pay

## 2023-10-05 ENCOUNTER — Other Ambulatory Visit: Payer: Self-pay

## 2023-10-05 DIAGNOSIS — N951 Menopausal and female climacteric states: Secondary | ICD-10-CM

## 2023-10-05 MED ORDER — ESTRADIOL 0.025 MG/24HR TD PTTW
1.0000 | MEDICATED_PATCH | TRANSDERMAL | 1 refills | Status: DC
  Filled 2023-10-05: qty 8, 28d supply, fill #0

## 2023-10-05 MED ORDER — PROGESTERONE MICRONIZED 100 MG PO CAPS
100.0000 mg | ORAL_CAPSULE | Freq: Every day | ORAL | 1 refills | Status: DC
  Filled 2023-10-05: qty 30, 30d supply, fill #0

## 2023-10-26 ENCOUNTER — Other Ambulatory Visit (HOSPITAL_COMMUNITY): Payer: Self-pay

## 2023-12-06 ENCOUNTER — Other Ambulatory Visit: Payer: Self-pay

## 2023-12-08 ENCOUNTER — Other Ambulatory Visit (HOSPITAL_COMMUNITY): Payer: Self-pay

## 2023-12-24 ENCOUNTER — Other Ambulatory Visit: Payer: Self-pay | Admitting: Obstetrics & Gynecology

## 2023-12-24 ENCOUNTER — Other Ambulatory Visit: Payer: Self-pay

## 2023-12-24 ENCOUNTER — Telehealth (HOSPITAL_COMMUNITY): Payer: Self-pay

## 2023-12-24 ENCOUNTER — Other Ambulatory Visit (HOSPITAL_COMMUNITY): Payer: Self-pay

## 2023-12-24 ENCOUNTER — Encounter (HOSPITAL_COMMUNITY): Payer: Self-pay

## 2023-12-24 ENCOUNTER — Encounter: Payer: Self-pay | Admitting: Obstetrics & Gynecology

## 2023-12-24 DIAGNOSIS — N951 Menopausal and female climacteric states: Secondary | ICD-10-CM

## 2023-12-24 MED ORDER — VEOZAH 45 MG PO TABS
1.0000 | ORAL_TABLET | Freq: Every day | ORAL | 6 refills | Status: AC
  Filled 2023-12-24 – 2024-01-07 (×2): qty 30, 30d supply, fill #0
  Filled 2024-02-05 – 2024-02-17 (×3): qty 30, 30d supply, fill #1
  Filled 2024-03-20: qty 30, 30d supply, fill #2
  Filled 2024-04-15: qty 30, 30d supply, fill #3

## 2023-12-24 NOTE — Telephone Encounter (Signed)
 Pharmacy Patient Advocate Encounter   Received notification from Pt Calls Messages that prior authorization for Veozah 45 mg tablets is required/requested.   Insurance verification completed.   The patient is insured through [facility].   Per test claim: PA required; However, NEW/RECENT labs/notes are needed to complete & submit PA request. Please see below.  *we need current chart notes

## 2023-12-24 NOTE — Progress Notes (Unsigned)
 Ashley Edwards is still havign hot flashes.  HRT caused leg cramps.  She would like to try Veozah.  Need baseline CMP and recheck liver enzymes at 3,6,9 months.

## 2023-12-24 NOTE — Progress Notes (Signed)
 Dr. Cris requested CMP ordered for today and at 3, 6, and 9 month intervals. Order placed. Patient notified via mychart.  Silvano LELON Piano, RN

## 2023-12-24 NOTE — Telephone Encounter (Signed)
 PA request has been Received. New Encounter has been or will be created for follow up. For additional info see Pharmacy Prior Auth telephone encounter from 12/24/23.

## 2023-12-27 ENCOUNTER — Telehealth: Payer: Self-pay

## 2023-12-27 NOTE — Telephone Encounter (Signed)
 RN returned patient call and notified of Dr. Leggett prescribing medication and need for CMP now and at 3, 6, and 9 months. Patient verbalized understanding and reported will have labs drawn.  Silvano LELON Piano, RN

## 2024-01-03 ENCOUNTER — Telehealth: Payer: Self-pay

## 2024-01-03 NOTE — Progress Notes (Signed)
 PA sent for [facility] to Cover My Meds, pending.  Silvano LELON Piano, RN

## 2024-01-03 NOTE — Telephone Encounter (Addendum)
 RN spoke with patient who reported plans to have CMP lab completed on Wednesday. RN reported would keep patient informed regarding PA for Veozah. Patient confirmed provider's instructions to complete labs before starting medication.   Silvano LELON Piano, RN

## 2024-01-06 LAB — COMPREHENSIVE METABOLIC PANEL WITH GFR
ALT: 20 IU/L (ref 0–32)
AST: 19 IU/L (ref 0–40)
Albumin: 4.1 g/dL (ref 3.8–4.9)
Alkaline Phosphatase: 98 IU/L (ref 49–135)
BUN/Creatinine Ratio: 16 (ref 9–23)
BUN: 14 mg/dL (ref 6–24)
Bilirubin Total: 0.4 mg/dL (ref 0.0–1.2)
CO2: 23 mmol/L (ref 20–29)
Calcium: 9.1 mg/dL (ref 8.7–10.2)
Chloride: 101 mmol/L (ref 96–106)
Creatinine, Ser: 0.87 mg/dL (ref 0.57–1.00)
Globulin, Total: 2.3 g/dL (ref 1.5–4.5)
Glucose: 97 mg/dL (ref 70–99)
Potassium: 4.9 mmol/L (ref 3.5–5.2)
Sodium: 138 mmol/L (ref 134–144)
Total Protein: 6.4 g/dL (ref 6.0–8.5)
eGFR: 81 mL/min/1.73 (ref >59)

## 2024-01-07 ENCOUNTER — Other Ambulatory Visit (HOSPITAL_COMMUNITY): Payer: Self-pay

## 2024-01-07 ENCOUNTER — Other Ambulatory Visit: Payer: Self-pay

## 2024-01-10 ENCOUNTER — Ambulatory Visit: Payer: Self-pay | Admitting: Obstetrics & Gynecology

## 2024-02-03 ENCOUNTER — Other Ambulatory Visit (HOSPITAL_COMMUNITY): Payer: Self-pay

## 2024-02-03 MED ORDER — FLUZONE 0.5 ML IM SUSY
0.5000 mL | PREFILLED_SYRINGE | Freq: Once | INTRAMUSCULAR | 0 refills | Status: AC
  Filled 2024-02-03: qty 0.5, 1d supply, fill #0

## 2024-02-07 ENCOUNTER — Other Ambulatory Visit: Payer: Self-pay

## 2024-02-16 ENCOUNTER — Other Ambulatory Visit (HOSPITAL_COMMUNITY): Payer: Self-pay

## 2024-02-17 ENCOUNTER — Other Ambulatory Visit (HOSPITAL_COMMUNITY): Payer: Self-pay

## 2024-03-20 ENCOUNTER — Other Ambulatory Visit (HOSPITAL_COMMUNITY): Payer: Self-pay

## 2024-03-20 ENCOUNTER — Other Ambulatory Visit: Payer: Self-pay

## 2024-04-14 ENCOUNTER — Encounter: Payer: Self-pay | Admitting: *Deleted

## 2024-04-15 ENCOUNTER — Other Ambulatory Visit: Payer: Self-pay | Admitting: Obstetrics & Gynecology

## 2024-04-15 ENCOUNTER — Other Ambulatory Visit (HOSPITAL_COMMUNITY): Payer: Self-pay

## 2024-04-16 ENCOUNTER — Other Ambulatory Visit: Payer: Self-pay

## 2024-04-17 ENCOUNTER — Other Ambulatory Visit (HOSPITAL_COMMUNITY): Payer: Self-pay

## 2024-04-17 MED ORDER — ATORVASTATIN CALCIUM 10 MG PO TABS
10.0000 mg | ORAL_TABLET | Freq: Every day | ORAL | 3 refills | Status: AC
  Filled 2024-04-17: qty 90, 90d supply, fill #0

## 2024-05-08 ENCOUNTER — Ambulatory Visit (HOSPITAL_BASED_OUTPATIENT_CLINIC_OR_DEPARTMENT_OTHER)

## 2024-05-08 ENCOUNTER — Other Ambulatory Visit

## 2024-05-15 ENCOUNTER — Ambulatory Visit: Admitting: Oncology
